# Patient Record
Sex: Female | Born: 1995 | Hispanic: Yes | Marital: Single | Smoking: Never smoker
Health system: Southern US, Community
[De-identification: ages and names within clinical notes are randomized; demographics above are authoritative.]

## PROBLEM LIST (undated history)

## (undated) DIAGNOSIS — Z789 Other specified health status: Secondary | ICD-10-CM

## (undated) HISTORY — PX: NO PAST SURGERIES: SHX2092

## (undated) HISTORY — DX: Other specified health status: Z78.9

---
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
## 2017-04-17 LAB — OB RESULTS CONSOLE ABO/RH: RH Type: POSITIVE

## 2017-04-17 LAB — OB RESULTS CONSOLE PLATELET COUNT: Platelets: 214

## 2017-04-17 LAB — OB RESULTS CONSOLE ANTIBODY SCREEN: Antibody Screen: NEGATIVE

## 2017-04-17 LAB — OB RESULTS CONSOLE HGB/HCT, BLOOD
HEMATOCRIT: 34
HEMOGLOBIN: 12

## 2017-04-17 LAB — OB RESULTS CONSOLE HEPATITIS B SURFACE ANTIGEN: HEP B S AG: NEGATIVE

## 2017-04-17 LAB — OB RESULTS CONSOLE GC/CHLAMYDIA
Chlamydia: NEGATIVE
Gonorrhea: NEGATIVE

## 2017-04-17 LAB — OB RESULTS CONSOLE VARICELLA ZOSTER ANTIBODY, IGG: Varicella: IMMUNE

## 2017-04-17 LAB — HEMOGLOBIN EVAL RFX ELECTROPHORESIS: Hemoglobin Evaluation: NORMAL

## 2017-04-17 LAB — OB RESULTS CONSOLE HIV ANTIBODY (ROUTINE TESTING): HIV: NONREACTIVE

## 2017-04-17 LAB — OB RESULTS CONSOLE RPR: RPR: NONREACTIVE

## 2017-04-17 LAB — OB RESULTS CONSOLE RUBELLA ANTIBODY, IGM: RUBELLA: IMMUNE

## 2017-07-10 ENCOUNTER — Encounter: Payer: Self-pay | Admitting: Advanced Practice Midwife

## 2017-07-23 ENCOUNTER — Telehealth: Payer: Self-pay | Admitting: Family Medicine

## 2017-07-23 ENCOUNTER — Encounter: Payer: Self-pay | Admitting: Family Medicine

## 2017-07-23 NOTE — Telephone Encounter (Signed)
Called patient about missed appointment. Was not to leave a message. Will send a certified letter.

## 2017-08-11 ENCOUNTER — Encounter: Payer: Self-pay | Admitting: Obstetrics & Gynecology

## 2017-08-11 ENCOUNTER — Encounter: Payer: Self-pay | Admitting: General Practice

## 2017-08-27 ENCOUNTER — Ambulatory Visit (INDEPENDENT_AMBULATORY_CARE_PROVIDER_SITE_OTHER): Payer: Self-pay | Admitting: Advanced Practice Midwife

## 2017-08-27 ENCOUNTER — Encounter: Payer: Self-pay | Admitting: Advanced Practice Midwife

## 2017-08-27 VITALS — BP 95/54 | HR 99 | Ht 62.0 in | Wt 144.0 lb

## 2017-08-27 DIAGNOSIS — Z3403 Encounter for supervision of normal first pregnancy, third trimester: Secondary | ICD-10-CM | POA: Insufficient documentation

## 2017-08-27 DIAGNOSIS — Z23 Encounter for immunization: Secondary | ICD-10-CM

## 2017-08-27 LAB — POCT URINALYSIS DIP (DEVICE)
BILIRUBIN URINE: NEGATIVE
GLUCOSE, UA: NEGATIVE mg/dL
HGB URINE DIPSTICK: NEGATIVE
KETONES UR: NEGATIVE mg/dL
LEUKOCYTES UA: NEGATIVE
Nitrite: NEGATIVE
Protein, ur: NEGATIVE mg/dL
SPECIFIC GRAVITY, URINE: 1.025 (ref 1.005–1.030)
Urobilinogen, UA: 0.2 mg/dL (ref 0.0–1.0)
pH: 6 (ref 5.0–8.0)

## 2017-08-27 NOTE — Progress Notes (Signed)
   PRENATAL VISIT NOTE  Subjective:  Alexandria [name] is a 21 y.o. G1P0 at [redacted]w[redacted]d by 18 wk ultrasound according to [facility] progress note being seen today for ongoing prenatal care.  Previously received prenatal care at [facility]. Patients says she had GTT at health department. She has been getting growth interval ultrasounds but is unsure why. She is currently monitored for the following issues for this low-risk pregnancy and has Encounter for supervision of normal first pregnancy in third trimester on her problem list.  Patient reports no complaints.  Contractions: Irritability. Vag. Bleeding: None.  Movement: Present. Denies leaking of fluid.   The following portions of the patient's history were reviewed and updated as appropriate: allergies, current medications, past family history, past medical history, past social history, past surgical history and problem list. Problem list updated.  Objective:   Vitals:   08/27/17 0824 08/27/17 0825 08/27/17 0827  BP: (!) 114/98  (!) 95/54  Pulse: 99    Weight: 144 lb (65.3 kg)    Height:   (1.575 m)     Fetal Status: Fetal Heart Rate (bpm): 140 Fundal Height: 28 cm Movement: Present     General:  Alert, oriented and cooperative. Patient is in no acute distress.  Skin: Skin is warm and dry. No rash noted.   Cardiovascular: Normal heart rate noted  Respiratory: Normal respiratory effort, no problems with respiration noted  Abdomen: Soft, gravid, appropriate for gestational age.  Pain/Pressure: Present     Pelvic: Cervical exam deferred        Extremities: Normal range of motion.  Edema: None  Mental Status:  Normal mood and affect. Normal behavior. Normal judgment and thought content.   Assessment and Plan:  Pregnancy: G1P0 at [redacted]w[redacted]d gestation  1. Encounter for supervision of normal first pregnancy in third trimester -reviewed records in Care Everywhere: noted that there was an abnormal quad screen diagnosis but  cannot view result and patient is uncertain of this diagnosis. Will request records from downtown [place]. Need GTT results. Need ultrasound reports for dating and serial ultrasounds for growth. Patient is measuring at 28 cm today and according to previous progress notes should be [redacted]w[redacted]d. Will wait for repeat ultrasound until we can review previous ultrasound reports.  Given tdap and flu today Will get GTT at next visit.  Preterm labor symptoms and general obstetric precautions including but not limited to vaginal bleeding, contractions, leaking of fluid and fetal movement were reviewed in detail with the patient. Please refer to After Visit Summary for other counseling recommendations.  Return in about 1 week (around 09/03/2017).   Rolm Bookbinder, DO

## 2017-08-27 NOTE — Addendum Note (Signed)
Addended by: Garret Reddish on: 08/27/2017 09:33 AM   Modules accepted: Orders, SmartSet

## 2017-08-27 NOTE — Patient Instructions (Signed)

## 2017-08-27 NOTE — Progress Notes (Signed)
Patient does not intend to breastfeed Medicaid home screening form filled out by patient ROI signed for records

## 2017-08-28 ENCOUNTER — Encounter: Payer: Self-pay | Admitting: *Deleted

## 2017-09-04 ENCOUNTER — Other Ambulatory Visit: Payer: Self-pay | Admitting: Obstetrics & Gynecology

## 2017-09-04 ENCOUNTER — Ambulatory Visit (INDEPENDENT_AMBULATORY_CARE_PROVIDER_SITE_OTHER): Payer: Self-pay | Admitting: Obstetrics & Gynecology

## 2017-09-04 ENCOUNTER — Encounter: Payer: Self-pay | Admitting: Student

## 2017-09-04 ENCOUNTER — Ambulatory Visit (HOSPITAL_COMMUNITY)
Admission: RE | Admit: 2017-09-04 | Discharge: 2017-09-04 | Disposition: A | Discharge status: Home / Self Care | Payer: Self-pay | Source: Ambulatory Visit | Attending: Obstetrics & Gynecology | Admitting: Obstetrics & Gynecology

## 2017-09-04 VITALS — BP 107/45 | HR 90 | Wt 141.9 lb

## 2017-09-04 DIAGNOSIS — Z3689 Encounter for other specified antenatal screening: Secondary | ICD-10-CM

## 2017-09-04 DIAGNOSIS — Z3403 Encounter for supervision of normal first pregnancy, third trimester: Secondary | ICD-10-CM

## 2017-09-04 DIAGNOSIS — O281 Abnormal biochemical finding on antenatal screening of mother: Secondary | ICD-10-CM

## 2017-09-04 DIAGNOSIS — Z3A35 35 weeks gestation of pregnancy: Secondary | ICD-10-CM

## 2017-09-04 IMAGING — US US MFM OB COMP +14 WKS
1 series · 14 of 28 positions shown · non-contrast
Comparison: none

[Series 1: us mfm ob comp +14 wks · 40 acquisitions, 14 frames shown]
[im 2/40]
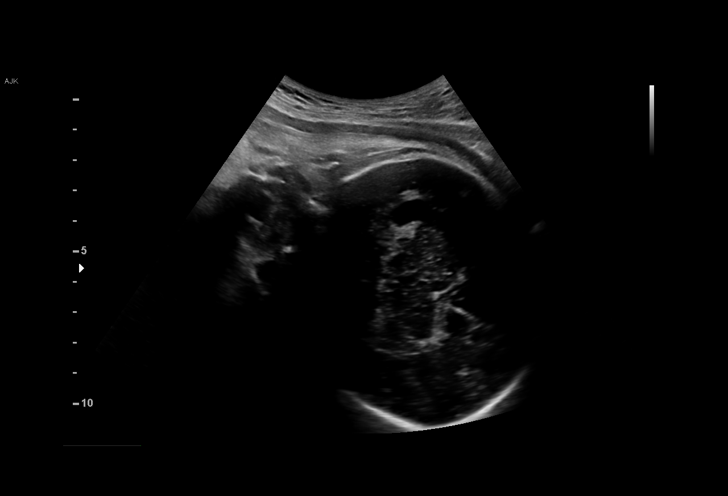
[im 5/40]
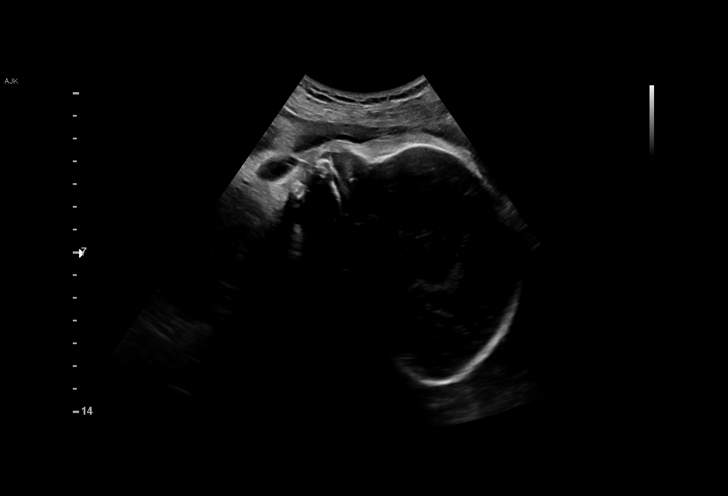
[im 8/40]
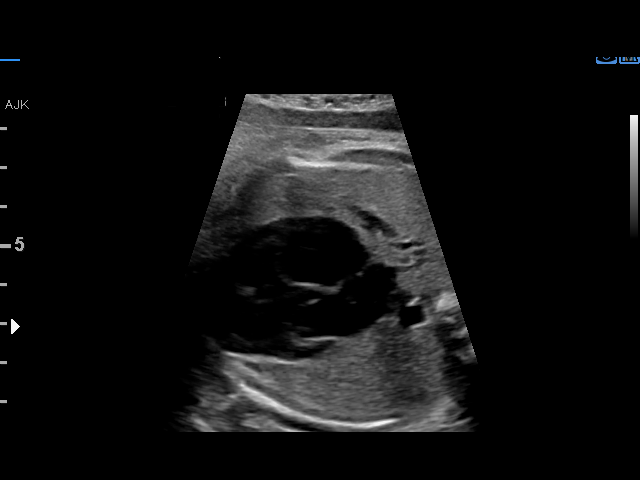
[im 11/40]
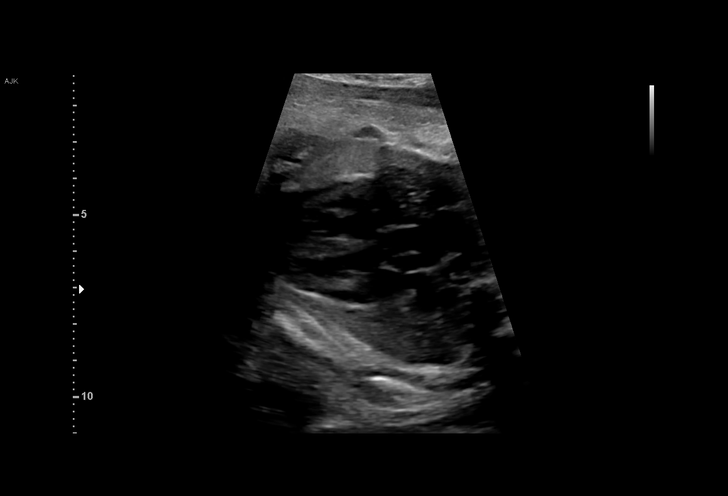
[im 14/40]
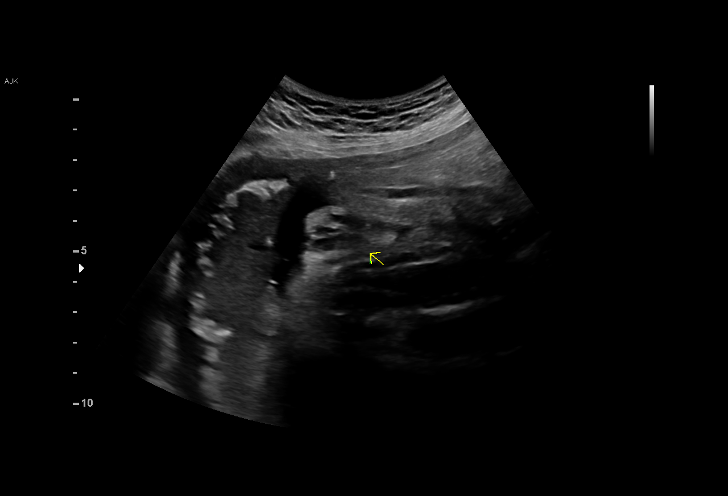
[im 16/40]
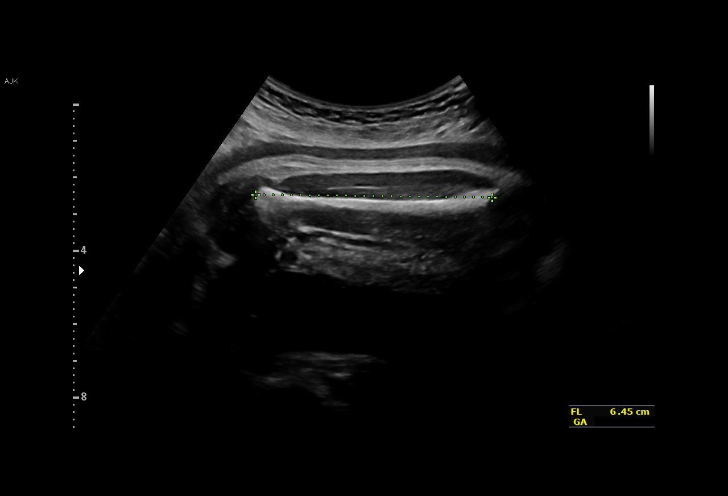
[im 19/40]
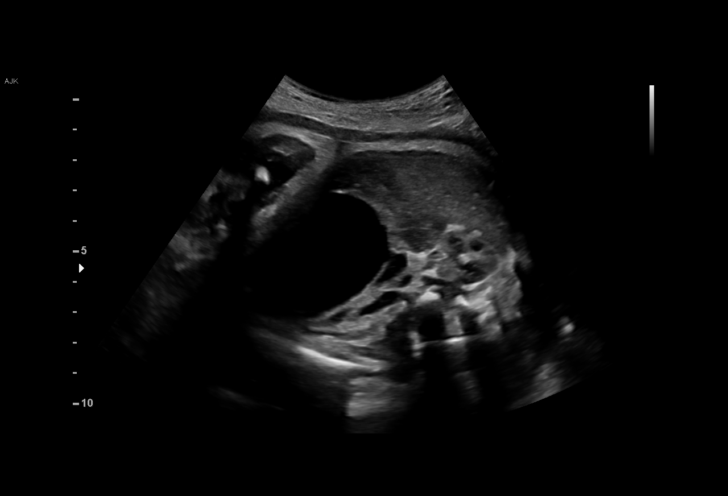
[im 22/40]
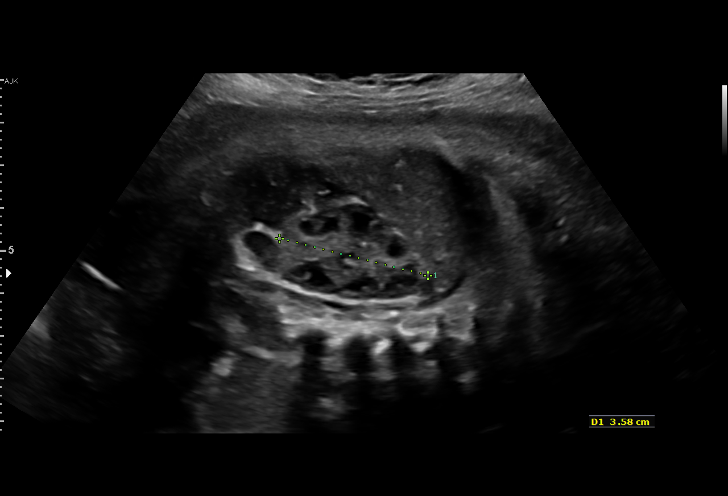
[im 25/40]
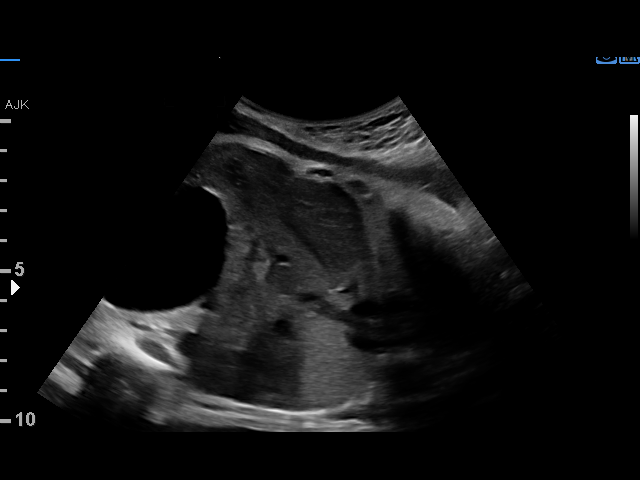
[im 28/40]
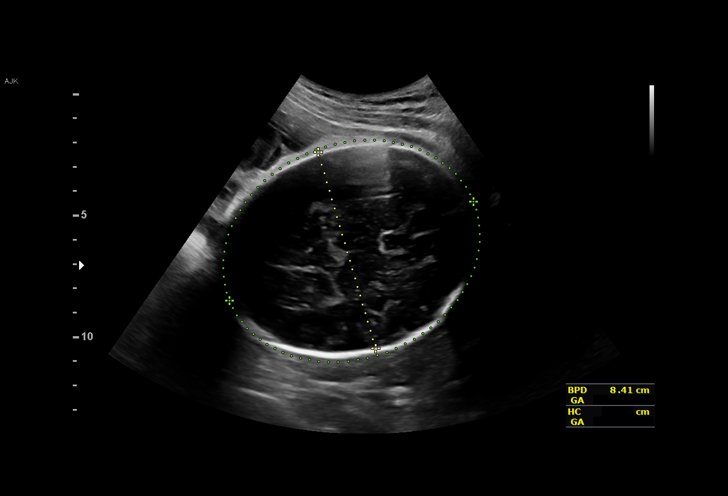
[im 31/40]
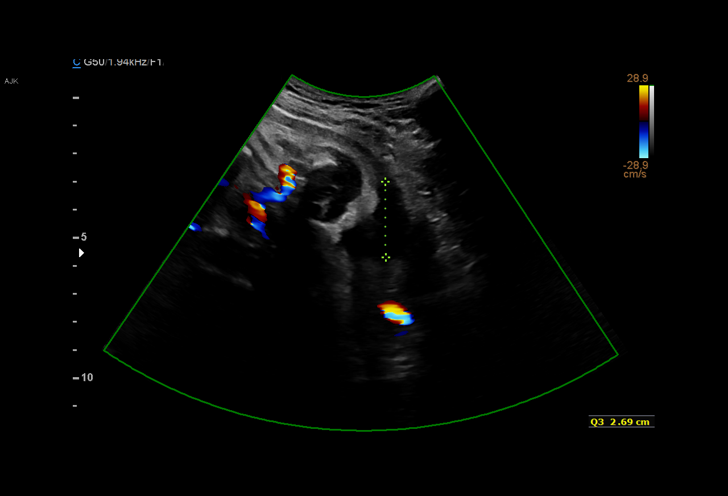
[im 34/40]
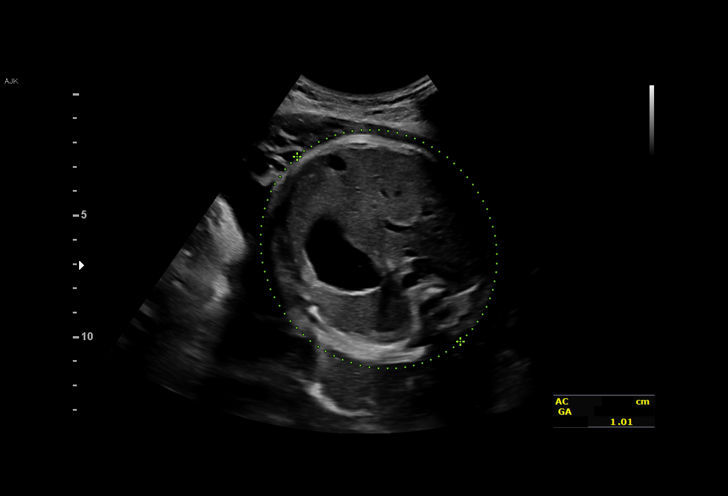
[im 37/40]
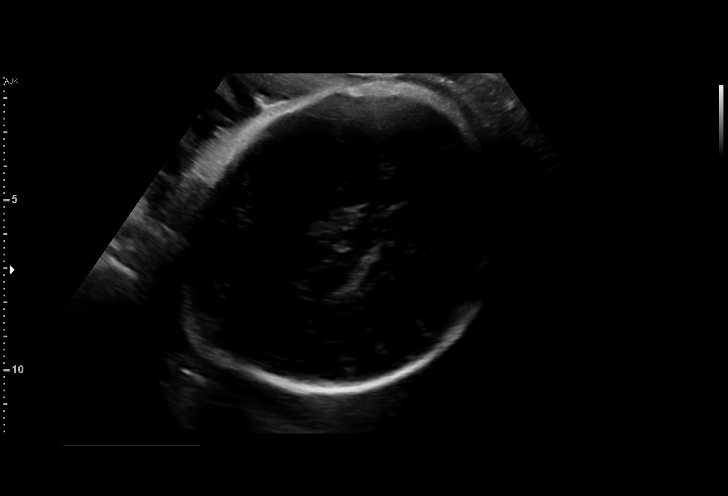
[im 40/40]
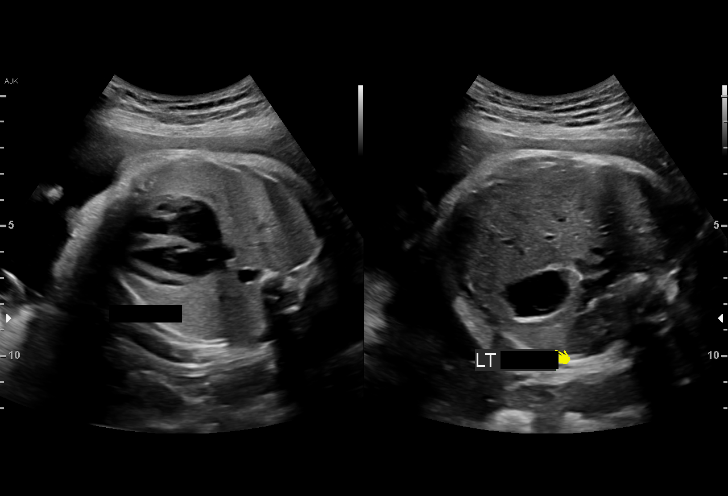

[14 of 28 positions shown; findings below may reference images not displayed]

[REDACTED]

1  MARIO SERGIO             [PHONE_NUMBER]      [PHONE_NUMBER]     [PHONE_NUMBER]
Indications

35 weeks gestation of pregnancy
Encounter for fetal anatomic survey            [EJ]
OB History

Gravidity:    1
Fetal Evaluation

Num Of Fetuses:     1
Fetal Heart         157
Rate(bpm):
Cardiac Activity:   Observed
Presentation:       Cephalic
Placenta:           Posterior, above cervical os

Amniotic Fluid
AFI FV:      Subjectively within normal limits

AFI Sum(cm)     %Tile       Largest Pocket(cm)
7.88            5

RUQ(cm)       RLQ(cm)       LUQ(cm)        LLQ(cm)
1.99
Biometry

BPD:      84.1  mm     G. Age:  33w 6d         21  %    CI:        74.65   %    70 - 86
FL/HC:       21.0  %    20.1 -
HC:      308.9  mm     G. Age:  34w 3d         10  %    HC/AC:       1.02       0.93 -
AC:      303.2  mm     G. Age:  34w 2d         36  %    FL/BPD:      77.3  %    71 - 87
FL:         65  mm     G. Age:  33w 4d         11  %    FL/AC:       21.4  %    20 - 24
Est. FW:    [EJ]   gm     5 lb 2 oz     42  %
Gestational Age

U/S Today:     34w 0d                                        EDD:   [DATE]
Best:          35w 0d     Det. By:  Previous Ultrasound      EDD:   [DATE]
([DATE])
Anatomy

Cranium:               Appears normal         Aortic Arch:            Not well visualized
Cavum:                 Appears normal         Ductal Arch:            Not well visualized
Ventricles:            Not well visualized    Diaphragm:              Appears normal
Choroid Plexus:        Appears normal         Stomach:                Appears normal, left
sided
Cerebellum:            Not well visualized    Abdomen:                Appears normal
Posterior Fossa:       Not well visualized    Abdominal Wall:         Not well visualized
Nuchal Fold:           Not applicable (>20    Cord Vessels:           Appears normal (3
wks GA)                                        vessel cord)
Face:                  Appears normal         Kidneys:                Appear normal
(orbits and profile)
Lips:                  Appears normal         Bladder:                Appears normal
Thoracic:              Appears normal         Spine:                  Not well visualized
Heart:                 Appears normal         Upper Extremities:      Not well visualized
(4CH, axis, and
situs)
RVOT:                  Appears normal         Lower Extremities:      Not well visualized
LVOT:                  Appears normal

Other:  Fetus appears to be a female. Technically difficult due to advanced
gestational age.
Cervix Uterus Adnexa

Cervix
Not visualized (advanced GA >[EJ])

Uterus
No abnormality visualized.

Left Ovary
Not visualized.

Right Ovary
Not visualized.

Adnexa:       No abnormality visualized.
Impression

SIUP at 35+0 weeks
Normal but limited detailed fetal anatomy; no gross
abnormalities identified
Normal amniotic fluid volume
Measurements consistent with a prior US; EFW at the 42nd
%tile
Recommendations

Follow-up as clinically indicated

## 2017-09-04 NOTE — Progress Notes (Signed)
2

## 2017-09-04 NOTE — Progress Notes (Signed)
   PRENATAL VISIT NOTE  Subjective:  Alexandria [name] is a 21 y.o. G1P0 at 104w0d being seen today for ongoing prenatal care.  She is currently monitored for the following issues for this high-risk pregnancy and has Encounter for supervision of normal first pregnancy in third trimester on her problem list.  Patient reports no complaints.  Contractions: Irregular. Vag. Bleeding: None.  Movement: Present. Denies leaking of fluid.   The following portions of the patient's history were reviewed and updated as appropriate: allergies, current medications, past family history, past medical history, past social history, past surgical history and problem list. Problem list updated.  Objective:   Vitals:   09/04/17 0758  BP: (!) 107/45  Pulse: 90  Weight: 141 lb 14.4 oz (64.4 kg)    Fetal Status: Fetal Heart Rate (bpm): 145 Fundal Height: 34 cm Movement: Present     General:  Alert, oriented and cooperative. Patient is in no acute distress.  Skin: Skin is warm and dry. No rash noted.   Cardiovascular: Normal heart rate noted  Respiratory: Normal respiratory effort, no problems with respiration noted  Abdomen: Soft, gravid, appropriate for gestational age.  Pain/Pressure: Present     Pelvic: Cervical exam deferred        Extremities: Normal range of motion.  Edema: None  Mental Status:  Normal mood and affect. Normal behavior. Normal judgment and thought content.   Assessment and Plan:  Pregnancy: G1P0 at [redacted]w[redacted]d  1. Encounter for supervision of normal first pregnancy in third trimester Had abnl Quad screen - [place] MFM OB FOLLOW UP; Future  Preterm labor symptoms and general obstetric precautions including but not limited to vaginal bleeding, contractions, leaking of fluid and fetal movement were reviewed in detail with the patient. Please refer to After Visit Summary for other counseling recommendations.  Return in about 1 week (around 09/11/2017).   Scheryl Darter, MD

## 2017-09-04 NOTE — Patient Instructions (Signed)
AREA PEDIATRIC/FAMILY PRACTICE PHYSICIANS  [facility] [address], [address] [place], [place]  [address] Phone - [phone]   Fax - [phone]  [facility] [address] [place], [place] [address] Phone - [phone]   Fax - [phone]  [facility] [address] [place], [place]  [address] Phone - [phone]   Fax - [phone]  [facility] [address] [place], [place]  [address] Phone - [phone]   Fax - [phone]  [facility] [address] [place], [place]  [address] Phone - [phone]   Fax - [phone]  [facility] [address] [place], [place]  [address] Phone - [phone]   Fax - [phone]  [facility] [address] [place], [place]  [address] Phone - [phone]   Fax - [phone]  [facility] [address] [place], [place]  [address] Phone - [phone]   Fax - [phone]  [facility] [address] [place], [place]  [address] Phone - [phone]   Fax - [phone] [facility] [address] [place], [place]  [address] Phone - [phone]   Fax - [phone]  [facility] [address] [place], [place]  [address] Phone - [phone]   Fax - [phone]  [facility] [address] [place], [place]  [address] Phone - [phone]   Fax - [phone]  [facility] [address] [place], [place]  [address] Phone - [phone]   Fax - [phone]  [facility] [address] [place], [place]  [address] Phone - [phone]  [facility] [address] [place], [place]  [address] Phone - [phone]   Fax - [phone]  [facility] [address] [place], [place]  [address] Phone - [phone]   Fax - [phone]  [facility] [address] [place], [place]  [address] Phone - [phone]   Fax - [phone]  [facility] [address] [place], [place]  [address] Phone - [phone]   Fax - [phone]   [facility] [address] [place], [place]  [address] Phone - [phone]   Fax - [phone]  [facility] [address] [place], [place]  [address] Phone - [phone]   Fax - [phone]  [facility] [address] [place], [place]  [address] Phone - [phone]   Fax - [phone]  [facility] [address] [place], [place]  [address] Phone - [phone]   Fax - [phone]  [facility] [address] [place], [place]  [address] Phone - [phone]   Fax - [phone] [facility] [address] [place], [place]  [address] Phone - [phone]   Fax - [phone]  [facility] [address] [place], [place]  [address] Phone - [phone]   Fax - [phone]  [facility] [address] ,   [address] Phone - [phone]   Fax - [phone]  [facility] Charlene Flemming MD [address] [place]  [address] Phone [phone]  Fax [phone]   

## 2017-09-11 ENCOUNTER — Encounter: Payer: Self-pay | Admitting: Obstetrics & Gynecology

## 2017-09-11 ENCOUNTER — Ambulatory Visit (INDEPENDENT_AMBULATORY_CARE_PROVIDER_SITE_OTHER): Payer: Self-pay | Admitting: Student

## 2017-09-11 VITALS — BP 105/67 | HR 82 | Wt 144.8 lb

## 2017-09-11 DIAGNOSIS — Z3403 Encounter for supervision of normal first pregnancy, third trimester: Secondary | ICD-10-CM

## 2017-09-11 DIAGNOSIS — Z113 Encounter for screening for infections with a predominantly sexual mode of transmission: Secondary | ICD-10-CM

## 2017-09-11 DIAGNOSIS — Z3400 Encounter for supervision of normal first pregnancy, unspecified trimester: Secondary | ICD-10-CM

## 2017-09-11 LAB — OB RESULTS CONSOLE GBS: STREP GROUP B AG: NEGATIVE

## 2017-09-11 NOTE — Patient Instructions (Signed)
Contracciones de Braxton Hicks °(Braxton Hicks Contractions) °Durante el embarazo, pueden presentarse contracciones uterinas que no siempre indican que está en trabajo de parto. °¿QUÉ SON LAS CONTRACCIONES DE BRAXTON HICKS? °Las contracciones que se presentan antes del trabajo de parto se conocen como contracciones de Braxton Hicks o falso trabajo de parto. Hacia el final del embarazo (32 a 34 semanas), estas contracciones pueden aparecen con más frecuencia y volverse más intensas. No corresponden al trabajo de parto verdadero porque estas contracciones no producen el agrandamiento (la dilatación) y el afinamiento del cuello del útero. Algunas veces, es difícil distinguirlas del trabajo de parto verdadero porque en algunos casos pueden ser muy intensas, y las personas tienen diferentes niveles de tolerancia al dolor. No debe sentirse avergonzada si concurre al hospital con falso trabajo de parto. En ocasiones, la única forma de saber si el trabajo de parto es verdadero es que el médico determine si hay cambios en el cuello del útero. °Si no hay problemas prenatales u otras complicaciones de salud asociadas con el embarazo, no habrá inconvenientes si la envían a su casa con falso trabajo de parto y espera que comience el verdadero. °CÓMO DIFERENCIAR EL TRABAJO DE PARTO FALSO DEL VERDADERO °Falso trabajo de parto  °· Las contracciones del falso trabajo de parto duran menos y no son tan intensas como las verdaderas. °· Generalmente son irregulares. °· A menudo, se sienten en la parte delantera de la parte baja del abdomen y en la ingle, °· y pueden desaparecer cuando camina o cambia de posición mientras está acostada. °· Las contracciones se vuelven más débiles y su duración es menor a medida que el tiempo transcurre. °· Por lo general, no se hacen progresivamente más intensas, regulares y cercanas entre sí como en el caso del trabajo de parto verdadero. °Verdadero trabajo de parto  °· Las contracciones del verdadero  trabajo de parto duran de 30 a 70 segundos, son muy regulares y suelen volverse más intensas, y aumenta su frecuencia. °· No desaparecen cuando camina. °· La molestia generalmente se siente en la parte superior del útero y se extiende hacia la zona inferior del abdomen y hacia la cintura. °· El médico podrá examinarla para determinar si el trabajo de parto es verdadero. El examen mostrará si el cuello del útero se está dilatando y afinando. °LO QUE DEBE RECORDAR °· Continúe haciendo los ejercicios habituales y siga otras indicaciones que el médico le dé. °· Tome todos los medicamentos como le indicó el médico. °· Concurra a las visitas prenatales regulares. °· Coma y beba con moderación si cree que está en trabajo de parto. °· Si las contracciones de Braxton Hicks le provocan incomodidad: °¨ Cambie de posición: si está acostada o descansando, camine; si está caminando, descanse. °¨ Siéntese y descanse en una bañera con agua tibia. °¨ Beba 2 o 3 vasos de agua. La deshidratación puede provocar contracciones. °¨ Respire lenta y profundamente varias veces por hora. °¿CUÁNDO DEBO BUSCAR ASISTENCIA MÉDICA INMEDIATA? °Solicite atención médica de inmediato si: °· Las contracciones se intensifican, se hacen más regulares y cercanas entre sí. °· Tiene una pérdida de líquido por la vagina. °· Tiene fiebre. °· Elimina mucosidad manchada con sangre. °· Tiene una hemorragia vaginal abundante. °· Tiene dolor abdominal permanente. °· Tiene un dolor en la zona lumbar que nunca tuvo antes. °· Siente que la cabeza del bebé empuja hacia abajo y ejerce presión en la zona pélvica. °· El bebé no se mueve tanto como solía. °Esta información no tiene como fin reemplazar el   consejo del médico. Asegúrese de hacerle al médico cualquier pregunta que tenga. °Document Released: 08/28/2005 Document Revised: 03/11/2016 Document Reviewed: 08/30/2013 °Elsevier Interactive Patient Education © 2017 Elsevier Inc[employer] ° °

## 2017-09-11 NOTE — Progress Notes (Signed)
   PRENATAL VISIT NOTE  Subjective:  Alexandria [name] is a 21 y.o. G1P0 at [redacted]w[redacted]d being seen today for ongoing prenatal care.  She is currently monitored for the following issues for this low-risk pregnancy and has Encounter for supervision of normal first pregnancy in third trimester on her problem list.  Patient reports no complaints.  Contractions: Irregular. Vag. Bleeding: None.  Movement: Present. Denies leaking of fluid.   The following portions of the patient's history were reviewed and updated as appropriate: allergies, current medications, past family history, past medical history, past social history, past surgical history and problem list. Problem list updated.  Objective:   Vitals:   09/11/17 1511  BP: 105/67  Pulse: 82  Weight: 144 lb 12.8 oz (65.7 kg)    Fetal Status: Fetal Heart Rate (bpm): 139 Fundal Height: 36 cm Movement: Present     General:  Alert, oriented and cooperative. Patient is in no acute distress.  Skin: Skin is warm and dry. No rash noted.   Cardiovascular: Normal heart rate noted  Respiratory: Normal respiratory effort, no problems with respiration noted  Abdomen: Soft, gravid, appropriate for gestational age.  Pain/Pressure: Present     Pelvic: Cervical exam deferred        Extremities: Normal range of motion.  Edema: None  Mental Status:  Normal mood and affect. Normal behavior. Normal judgment and thought content.   Assessment and Plan:  Pregnancy: G1P0 at [redacted]w[redacted]d  1. Supervision of normal first pregnancy, antepartum Patient doing well, no complaints.  - GC/Chlamydia probe amp ([place])not at [facility] - Culture, beta strep (group b only)  Term labor symptoms and general obstetric precautions including but not limited to vaginal bleeding, contractions, leaking of fluid and fetal movement were reviewed in detail with the patient. Please refer to After Visit Summary for other counseling recommendations.  Return in about 1 week (around  09/18/2017), or ROB.   Marylene Land, CNM

## 2017-09-12 LAB — GC/CHLAMYDIA PROBE AMP (~~LOC~~) NOT AT ARMC
CHLAMYDIA, DNA PROBE: NEGATIVE
NEISSERIA GONORRHEA: NEGATIVE

## 2017-09-15 LAB — CULTURE, BETA STREP (GROUP B ONLY): STREP GP B CULTURE: NEGATIVE

## 2017-09-16 ENCOUNTER — Encounter: Payer: Self-pay | Admitting: *Deleted

## 2017-09-18 ENCOUNTER — Ambulatory Visit (INDEPENDENT_AMBULATORY_CARE_PROVIDER_SITE_OTHER): Payer: Self-pay | Admitting: Student

## 2017-09-18 DIAGNOSIS — Z3403 Encounter for supervision of normal first pregnancy, third trimester: Secondary | ICD-10-CM

## 2017-09-18 NOTE — Patient Instructions (Signed)
Contracciones de Braxton Hicks °(Braxton Hicks Contractions) °Durante el embarazo, pueden presentarse contracciones uterinas que no siempre indican que está en trabajo de parto. °¿QUÉ SON LAS CONTRACCIONES DE BRAXTON HICKS? °Las contracciones que se presentan antes del trabajo de parto se conocen como contracciones de Braxton Hicks o falso trabajo de parto. Hacia el final del embarazo (32 a 34 semanas), estas contracciones pueden aparecen con más frecuencia y volverse más intensas. No corresponden al trabajo de parto verdadero porque estas contracciones no producen el agrandamiento (la dilatación) y el afinamiento del cuello del útero. Algunas veces, es difícil distinguirlas del trabajo de parto verdadero porque en algunos casos pueden ser muy intensas, y las personas tienen diferentes niveles de tolerancia al dolor. No debe sentirse avergonzada si concurre al hospital con falso trabajo de parto. En ocasiones, la única forma de saber si el trabajo de parto es verdadero es que el médico determine si hay cambios en el cuello del útero. °Si no hay problemas prenatales u otras complicaciones de salud asociadas con el embarazo, no habrá inconvenientes si la envían a su casa con falso trabajo de parto y espera que comience el verdadero. °CÓMO DIFERENCIAR EL TRABAJO DE PARTO FALSO DEL VERDADERO °Falso trabajo de parto  °· Las contracciones del falso trabajo de parto duran menos y no son tan intensas como las verdaderas. °· Generalmente son irregulares. °· A menudo, se sienten en la parte delantera de la parte baja del abdomen y en la ingle, °· y pueden desaparecer cuando camina o cambia de posición mientras está acostada. °· Las contracciones se vuelven más débiles y su duración es menor a medida que el tiempo transcurre. °· Por lo general, no se hacen progresivamente más intensas, regulares y cercanas entre sí como en el caso del trabajo de parto verdadero. °Verdadero trabajo de parto  °· Las contracciones del verdadero  trabajo de parto duran de 30 a 70 segundos, son muy regulares y suelen volverse más intensas, y aumenta su frecuencia. °· No desaparecen cuando camina. °· La molestia generalmente se siente en la parte superior del útero y se extiende hacia la zona inferior del abdomen y hacia la cintura. °· El médico podrá examinarla para determinar si el trabajo de parto es verdadero. El examen mostrará si el cuello del útero se está dilatando y afinando. °LO QUE DEBE RECORDAR °· Continúe haciendo los ejercicios habituales y siga otras indicaciones que el médico le dé. °· Tome todos los medicamentos como le indicó el médico. °· Concurra a las visitas prenatales regulares. °· Coma y beba con moderación si cree que está en trabajo de parto. °· Si las contracciones de Braxton Hicks le provocan incomodidad: °¨ Cambie de posición: si está acostada o descansando, camine; si está caminando, descanse. °¨ Siéntese y descanse en una bañera con agua tibia. °¨ Beba 2 o 3 vasos de agua. La deshidratación puede provocar contracciones. °¨ Respire lenta y profundamente varias veces por hora. °¿CUÁNDO DEBO BUSCAR ASISTENCIA MÉDICA INMEDIATA? °Solicite atención médica de inmediato si: °· Las contracciones se intensifican, se hacen más regulares y cercanas entre sí. °· Tiene una pérdida de líquido por la vagina. °· Tiene fiebre. °· Elimina mucosidad manchada con sangre. °· Tiene una hemorragia vaginal abundante. °· Tiene dolor abdominal permanente. °· Tiene un dolor en la zona lumbar que nunca tuvo antes. °· Siente que la cabeza del bebé empuja hacia abajo y ejerce presión en la zona pélvica. °· El bebé no se mueve tanto como solía. °Esta información no tiene como fin reemplazar el   consejo del médico. Asegúrese de hacerle al médico cualquier pregunta que tenga. °Document Released: 08/28/2005 Document Revised: 03/11/2016 Document Reviewed: 08/30/2013 °Elsevier Interactive Patient Education © 2017 Elsevier Inc[employer] ° °

## 2017-09-18 NOTE — Progress Notes (Signed)
   PRENATAL VISIT NOTE  Subjective:  Alexandria [name] is a 21 y.o. G1P0 at 4634w0d being seen today for ongoing prenatal care.  She is currently monitored for the following issues for this low-risk pregnancy and has Encounter for supervision of normal first pregnancy in third trimester on her problem list.  Patient reports no complaints.  Contractions: Irregular. Vag. Bleeding: None.  Movement: Present. Denies leaking of fluid.   The following portions of the patient's history were reviewed and updated as appropriate: allergies, current medications, past family history, past medical history, past social history, past surgical history and problem list. Problem list updated.  Objective:   Vitals:   09/18/17 0750  BP: (!) 108/58  Pulse: (!) 105  Weight: 148 lb 8 oz (67.4 kg)    Fetal Status: Fetal Heart Rate (bpm): 143 Fundal Height: 35 cm Movement: Present     General:  Alert, oriented and cooperative. Patient is in no acute distress.  Skin: Skin is warm and dry. No rash noted.   Cardiovascular: Normal heart rate noted  Respiratory: Normal respiratory effort, no problems with respiration noted  Abdomen: Soft, gravid, appropriate for gestational age.  Pain/Pressure: Absent     Pelvic: Cervical exam deferred        Extremities: Normal range of motion.  Edema: None  Mental Status:  Normal mood and affect. Normal behavior. Normal judgment and thought content.   Assessment and Plan:  Pregnancy: G1P0 at 134w0d  1. Encounter for supervision of normal first pregnancy in third trimester Patient doing well, no complaints.  - Glucose tolerance, 1 hour  Term labor symptoms and general obstetric precautions including but not limited to vaginal bleeding, contractions, leaking of fluid and fetal movement were reviewed in detail with the patient. Please refer to After Visit Summary for other counseling recommendations.  Return in about 1 week (around 09/25/2017), or ROB.   Marylene LandKathryn  Lorraine Delynn Olvera, CNM

## 2017-09-19 LAB — GLUCOSE TOLERANCE, 1 HOUR: GLUCOSE, 1HR PP: 116 mg/dL (ref 65–199)

## 2017-09-25 ENCOUNTER — Ambulatory Visit (INDEPENDENT_AMBULATORY_CARE_PROVIDER_SITE_OTHER): Payer: Self-pay | Admitting: Student

## 2017-09-25 DIAGNOSIS — Z3403 Encounter for supervision of normal first pregnancy, third trimester: Secondary | ICD-10-CM

## 2017-09-25 NOTE — Progress Notes (Signed)
   PRENATAL VISIT NOTE  Subjective:  Alexandria [name] is a 21 y.o. G1P0 at 234w0d being seen today for ongoing prenatal care.  She is currently monitored for the following issues for this low-risk pregnancy and has Encounter for supervision of normal first pregnancy in third trimester on her problem list.  Patient reports no complaints.  Contractions: Irregular. Vag. Bleeding: None.  Movement: Present. Denies leaking of fluid.   The following portions of the patient's history were reviewed and updated as appropriate: allergies, current medications, past family history, past medical history, past social history, past surgical history and problem list. Problem list updated.  Objective:   Vitals:   09/25/17 1118  BP: 124/64  Pulse: 92  Weight: 149 lb 9.6 oz (67.9 kg)    Fetal Status: Fetal Heart Rate (bpm): 144 Fundal Height: 37 cm Movement: Present  Presentation: Vertex  General:  Alert, oriented and cooperative. Patient is in no acute distress.  Skin: Skin is warm and dry. No rash noted.   Cardiovascular: Normal heart rate noted  Respiratory: Normal respiratory effort, no problems with respiration noted  Abdomen: Soft, gravid, appropriate for gestational age.  Pain/Pressure: Present     Pelvic: Cervical exam performed Dilation: Closed      Extremities: Normal range of motion.  Edema: None  Mental Status:  Normal mood and affect. Normal behavior. Normal judgment and thought content.   Assessment and Plan:  Pregnancy: G1P0 at 4534w0d  1. Encounter for supervision of normal first pregnancy in third trimester Patient doing well, reviewed warning signs and when to visit MAU.   Term labor symptoms and general obstetric precautions including but not limited to vaginal bleeding, contractions, leaking of fluid and fetal movement were reviewed in detail with the patient. Please refer to After Visit Summary for other counseling recommendations.  Return in about 1 week (around  10/02/2017), or ROB.   Alexandria [name], CNM

## 2017-09-25 NOTE — Patient Instructions (Signed)
Vaginal Delivery Vaginal delivery means that you will give birth by pushing your baby out of your birth canal (vagina). A team of health care providers will help you before, during, and after vaginal delivery. Birth experiences are unique for every woman and every pregnancy, and birth experiences vary depending on where you choose to give birth. What should I do to prepare for my baby's birth? Before your baby is born, it is important to talk with your health care provider about:  Your labor and delivery preferences. These may include: ? Medicines that you may be given. ? How you will manage your pain. This might include non-medical pain relief techniques or injectable pain relief such as epidural analgesia. ? How you and your baby will be monitored during labor and delivery. ? Who may be in the labor and delivery room with you. ? Your feelings about surgical delivery of your baby (cesarean delivery, or C-section) if this becomes necessary. ? Your feelings about receiving donated blood through an IV tube (blood transfusion) if this becomes necessary.  Whether you are able: ? To take pictures or videos of the birth. ? To eat during labor and delivery. ? To move around, walk, or change positions during labor and delivery.  What to expect after your baby is born, such as: ? Whether delayed umbilical cord clamping and cutting is offered. ? Who will care for your baby right after birth. ? Medicines or tests that may be recommended for your baby. ? Whether breastfeeding is supported in your hospital or birth center. ? How long you will be in the hospital or birth center.  How any medical conditions you have may affect your baby or your labor and delivery experience.  To prepare for your baby's birth, you should also:  Attend all of your health care visits before delivery (prenatal visits) as recommended by your health care provider. This is important.  Prepare your home for your baby's  arrival. Make sure that you have: ? Diapers. ? Baby clothing. ? Feeding equipment. ? Safe sleeping arrangements for you and your baby.  Install a car seat in your vehicle. Have your car seat checked by a certified car seat installer to make sure that it is installed safely.  Think about who will help you with your new baby at home for at least the first several weeks after delivery.  What can I expect when I arrive at the birth center or hospital? Once you are in labor and have been admitted into the hospital or birth center, your health care provider may:  Review your pregnancy history and any concerns you have.  Insert an IV tube into one of your veins. This is used to give you fluids and medicines.  Check your blood pressure, pulse, temperature, and heart rate (vital signs).  Check whether your bag of water (amniotic sac) has broken (ruptured).  Talk with you about your birth plan and discuss pain control options.  Monitoring Your health care provider may monitor your contractions (uterine monitoring) and your baby's heart rate (fetal monitoring). You may need to be monitored:  Often, but not continuously (intermittently).  All the time or for long periods at a time (continuously). Continuous monitoring may be needed if: ? You are taking certain medicines, such as medicine to relieve pain or make your contractions stronger. ? You have pregnancy or labor complications.  Monitoring may be done by:  Placing a special stethoscope or a handheld monitoring device on your abdomen to   check your baby's heartbeat, and feeling your abdomen for contractions. This method of monitoring does not continuously record your baby's heartbeat or your contractions.  Placing monitors on your abdomen (external monitors) to record your baby's heartbeat and the frequency and length of contractions. You may not have to wear external monitors all the time.  Placing monitors inside of your uterus  (internal monitors) to record your baby's heartbeat and the frequency, length, and strength of your contractions. ? Your health care provider may use internal monitors if he or she needs more information about the strength of your contractions or your baby's heart rate. ? Internal monitors are put in place by passing a thin, flexible wire through your vagina and into your uterus. Depending on the type of monitor, it may remain in your uterus or on your baby's head until birth. ? Your health care provider will discuss the benefits and risks of internal monitoring with you and will ask for your permission before inserting the monitors.  Telemetry. This is a type of continuous monitoring that can be done with external or internal monitors. Instead of having to stay in bed, you are able to move around during telemetry. Ask your health care provider if telemetry is an option for you.  Physical exam Your health care provider may perform a physical exam. This may include:  Checking whether your baby is positioned: ? With the head toward your vagina (head-down). This is most common. ? With the head toward the top of your uterus (head-up or breech). If your baby is in a breech position, your health care provider may try to turn your baby to a head-down position so you can deliver vaginally. If it does not seem that your baby can be born vaginally, your provider may recommend surgery to deliver your baby. In rare cases, you may be able to deliver vaginally if your baby is head-up (breech delivery). ? Lying sideways (transverse). Babies that are lying sideways cannot be delivered vaginally.  Checking your cervix to determine: ? Whether it is thinning out (effacing). ? Whether it is opening up (dilating). ? How low your baby has moved into your birth canal.  What are the three stages of labor and delivery?  Normal labor and delivery is divided into the following three stages: Stage 1  Stage 1 is the  longest stage of labor, and it can last for hours or days. Stage 1 includes: ? Early labor. This is when contractions may be irregular, or regular and mild. Generally, early labor contractions are more than 10 minutes apart. ? Active labor. This is when contractions get longer, more regular, more frequent, and more intense. ? The transition phase. This is when contractions happen very close together, are very intense, and may last longer than during any other part of labor.  Contractions generally feel mild, infrequent, and irregular at first. They get stronger, more frequent (about every 2-3 minutes), and more regular as you progress from early labor through active labor and transition.  Many women progress through stage 1 naturally, but you may need help to continue making progress. If this happens, your health care provider may talk with you about: ? Rupturing your amniotic sac if it has not ruptured yet. ? Giving you medicine to help make your contractions stronger and more frequent.  Stage 1 ends when your cervix is completely dilated to 4 inches (10 cm) and completely effaced. This happens at the end of the transition phase. Stage 2  Once   your cervix is completely effaced and dilated to 4 inches (10 cm), you may start to feel an urge to push. It is common for the body to naturally take a rest before feeling the urge to push, especially if you received an epidural or certain other pain medicines. This rest period may last for up to 1-2 hours, depending on your unique labor experience.  During stage 2, contractions are generally less painful, because pushing helps relieve contraction pain. Instead of contraction pain, you may feel stretching and burning pain, especially when the widest part of your baby's head passes through the vaginal opening (crowning).  Your health care provider will closely monitor your pushing progress and your baby's progress through the vagina during stage 2.  Your  health care provider may massage the area of skin between your vaginal opening and anus (perineum) or apply warm compresses to your perineum. This helps it stretch as the baby's head starts to crown, which can help prevent perineal tearing. ? In some cases, an incision may be made in your perineum (episiotomy) to allow the baby to pass through the vaginal opening. An episiotomy helps to make the opening of the vagina larger to allow more room for the baby to fit through.  It is very important to breathe and focus so your health care provider can control the delivery of your baby's head. Your health care provider may have you decrease the intensity of your pushing, to help prevent perineal tearing.  After delivery of your baby's head, the shoulders and the rest of the body generally deliver very quickly and without difficulty.  Once your baby is delivered, the umbilical cord may be cut right away, or this may be delayed for 1-2 minutes, depending on your baby's health. This may vary among health care providers, hospitals, and birth centers.  If you and your baby are healthy enough, your baby may be placed on your chest or abdomen to help maintain the baby's temperature and to help you bond with each other. Some mothers and babies start breastfeeding at this time. Your health care team will dry your baby and help keep your baby warm during this time.  Your baby may need immediate care if he or she: ? Showed signs of distress during labor. ? Has a medical condition. ? Was born too early (prematurely). ? Had a bowel movement before birth (meconium). ? Shows signs of difficulty transitioning from being inside the uterus to being outside of the uterus. If you are planning to breastfeed, your health care team will help you begin a feeding. Stage 3  The third stage of labor starts immediately after the birth of your baby and ends after you deliver the placenta. The placenta is an organ that develops  during pregnancy to provide oxygen and nutrients to your baby in the womb.  Delivering the placenta may require some pushing, and you may have mild contractions. Breastfeeding can stimulate contractions to help you deliver the placenta.  After the placenta is delivered, your uterus should tighten (contract) and become firm. This helps to stop bleeding in your uterus. To help your uterus contract and to control bleeding, your health care provider may: ? Give you medicine by injection, through an IV tube, by mouth, or through your rectum (rectally). ? Massage your abdomen or perform a vaginal exam to remove any blood clots that are left in your uterus. ? Empty your bladder by placing a thin, flexible tube (catheter) into your bladder. ? Encourage   you to breastfeed your baby. After labor is over, you and your baby will be monitored closely to ensure that you are both healthy until you are ready to go home. Your health care team will teach you how to care for yourself and your baby. This information is not intended to replace advice given to you by your health care provider. Make sure you discuss any questions you have with your health care provider. Document Released: 08/27/2008 Document Revised: 06/07/2016 Document Reviewed: 12/03/2015 Elsevier Interactive Patient Education  2018 [employer]  

## 2017-10-02 ENCOUNTER — Ambulatory Visit (INDEPENDENT_AMBULATORY_CARE_PROVIDER_SITE_OTHER): Payer: Self-pay | Admitting: Student

## 2017-10-02 VITALS — BP 106/62 | HR 104 | Wt 148.0 lb

## 2017-10-02 DIAGNOSIS — Z3403 Encounter for supervision of normal first pregnancy, third trimester: Secondary | ICD-10-CM

## 2017-10-02 NOTE — Patient Instructions (Signed)
Crecimiento del beb durante el embarazo (How a Baby Grows During Pregnancy) El embarazo comienza cuando el semen de un hombre ingresa al vulo de una mujer (fecundacin). Esto ocurre en una de las trompas de Falopio que conecta los ovarios con el tero. Al vulo fecundado se lo denomina embrin hasta que alcanza las 10semanas. A partir de las 10semanas y hasta el momento del parto, se llama feto. El vulo fecundado se desplaza por la trompa de Falopio hasta llegar al tero y luego se implanta en el endometrio y empieza crecer. El feto en crecimiento recibe oxgeno y nutrientes a travs del torrente sanguneo de la embarazada y de los tejidos que se forman (placenta) para la sustentacin fetal. La placenta es el sistema de sustentacin de la vida del feto, proporciona la nutricin y elimina los desechos. Informarse tanto como pueda sobre el embarazo y la forma en que se desarrolla el beb puede ayudarla a disfrutar de la experiencia, y, adems, a que se d cuenta de cundo puede haber un problema y cundo hacer preguntas. CUNTO DURA UN EMBARAZO NORMAL? Generalmente, el embarazo dura 280das, o unas 40semanas. Se divide tres trimestres:  Primer trimestre: desde la semana0 a la13.  Segundo trimestre: desde la semana14 a la27.  Tercer trimestre: desde la semana28 a la40. El da que se considera que el beb est listo para nacer (a trmino) es la fecha prevista de parto. CMO SE DESARROLLA EL BEB MES A MES? Primer mes  El vulo fecundado se implanta dentro del tero.  Algunas clulas formarn la placenta, y otras formarn el feto.  Empiezan a desarrollarse los brazos, las piernas, la mdula espinal, los pulmones y el corazn.  Al final del primer mes, el corazn comienza a latir. Segundo mes  Se forman los huesos, el odo interno, los prpados, las manos y los pies.  Se desarrollan los genitales.  Al final de las 8semanas, todos los rganos importantes estn en  desarrollo. Tercer mes  Se estn formando todos los rganos internos.  Se forman los dientes debajo de las encas.  Empiezan a crecer los huesos y los msculos. La columna vertebral tiene movimiento de flexin.  La piel es transparente.  Empiezan a formarse las uas de las manos y de los pies.  Los brazos y las piernas siguen alargndose, y se desarrollan las manos y los pies.  El feto mide aproximadamente 3pulgadas (7,6cm) de largo. Cuarto mes  La placenta est totalmente formada.  Se han formado los rganos sexuales externos, el cuello, las orejas, las cejas, los prpados y las uas de las manos.  El feto puede or, tragar y mover los brazos y las piernas.  Los riones empiezan a producir orina.  La piel est recubierta por una sustancia sebcea blanca (unto sebceo) y un vello muy fino (lanugo). Quinto mes  El feto se mueve ms y es posible sentirlo por primera vez (da pataditas).  Empieza a dormir y despertarse, y tal vez comience a chuparse el dedo.  Crecen las uas en las puntas de los dedos.  Funciona el rgano del sistema digestivo que produce bilis (vescula biliar) y ayuda a digerir los nutrientes.  Si el beb es nia, tiene vulos en los ovarios. Si el beb es varn, los testculos empiezan a descender hasta el escroto. Sexto mes  Se han formado los pulmones, pero el feto an no puede respirar.  Los ojos se abren. El cerebro sigue desarrollndose.  El beb tiene huellas en los dedos de las manos y   los pies. El cabello del beb se vuelve ms abundante.  A fines del segundo trimestre, el feto mide aproximadamente 9pulgadas (22,9cm) de largo. Sptimo mes  El feto patea y se estira.  Los ojos se han desarrollado lo suficiente como para percibir los cambios de luz.  Las manos pueden hacer movimientos de prensin.  El feto responde a los ruidos. Octavo mes  Todos los rganos, as como los sistemas y aparatos del organismo, estn totalmente  desarrollados y en funcionamiento.  Los huesos se solidifican, y se desarrollan los botones gustativos. Es posible que el feto tenga hipo.  Determinadas regiones del cerebro an se estn desarrollando. El crneo sigue siendo blando. Noveno mes  El feto aumenta aproximadamente libra (230g) cada semana.  Los pulmones estn totalmente desarrollados.  Se desarrollan los hbitos de sueo.  Generalmente, el feto se acomoda con la cabeza hacia abajo (presentacin ceflica de vrtice) en el tero para prepararse para el parto. En cambio, si los glteos se acomodan en esta posicin, el beb est de nalgas.  El feto pesa entre 6 y 9libras (2,72 y 4,08kg) y mide entre 19 y 20pulgadas (48,26 a 50,8cm) de largo. QU PUEDO HACER PARA QUE EL EMBARAZO SEA SANO Y PARA AYUDAR AL BEB A DESARROLLARSE? Comida y bebida  Consuma una dieta saludable. ? Hable con el mdico para asegurarse de que est recibiendo los nutrientes que usted y el beb necesitan. ? Visite www.choosemyplate.gov para obtener ms informacin sobre cmo crear una dieta saludable.  El mdico le aconsejar cul es la cantidad saludable de peso a aumentar durante el embarazo, por lo general, entre 25 y 35libras (11 y 16kg). Puede ser necesario que: ? Aumente ms si tena bajo peso antes de quedar embarazada o si est embarazada de ms de un beb. ? Aumente menos si tena sobrepeso u obesidad cuando qued embarazada. Medicamentos y vitaminas  Tome las vitaminas prenatales como se lo haya indicado el mdico, entre ellas, cido flico, hierro, calcio y vitaminaD, que son importantes para el desarrollo saludable.  Tome los medicamentos solamente como se lo haya indicado el mdico. Lea las etiquetas y consulte al farmacutico o al mdico si puede tomar medicamentos de venta libre, suplementos y medicamentos recetados durante el embarazo. Actividades  Haga actividad fsica como se lo haya aconsejado el mdico. Pdale al mdico que  le recomiende actividades que sean seguras para usted, como caminar o practicar natacin.  No participe en deportes extremos ni extenuantes. Estilo de vida  No beba alcohol.  No consuma ningn producto que contenga tabaco, lo que incluye cigarrillos, tabaco de mascar o cigarrillos electrnicos. Si necesita ayuda para dejar de fumar, consulte al mdico.  No consuma drogas. Seguridad  No se exponga al mercurio, al plomo ni a otros metales pesados. Pregntele al mdico acerca de las fuentes comunes de estos metales pesados.  Evite la infeccin por listeria durante el embarazo. Tome las siguientes precauciones: ? No coma quesos blandos ni fiambres. ? No coma perros calientes, salvo que hayan sido calentados al punto de emitir vapor, por ejemplo, en el microondas. ? No tome leche no pasteurizada.  Evite la infeccin por toxoplasmosis durante el embarazo. Tome las siguientes precauciones: ? No cambie la arena sanitaria del gato, si tiene uno. Pdale a otra persona que lo haga por usted. ? Use guantes de jardinera mientras trabaja en el jardn. Instrucciones generales  Concurra a todas las visitas de control como se lo haya indicado el mdico. Esto es importante. Estas incluyen las visitas   de cuidado prenatal y las pruebas de deteccin.  Mantenga las enfermedades crnicas bajo control. Trabaje en estrecha colaboracin con el mdico para mantener las enfermedades bajo control, por ejemplo, la diabetes. CMO S SI EL BEB SE EST DESARROLLANDO BIEN? En cada visita de cuidado prenatal, el mdico har varios estudios diferentes para controlar su estado de salud y hacer un seguimiento del desarrollo del beb. Estos incluyen los siguientes:  Altura uterina. ? El mdico le medir el vientre en crecimiento desde la parte superior a la inferior con una cinta mtrica. ? Adems, le palpar el vientre para determinar la posicin del beb.  Latido cardaco. ? Una ecografa realizada en el primer  trimestre puede confirmar el embarazo y mostrar un latido cardaco, dependiendo del tiempo de gestacin. ? El mdico controlar la frecuencia cardaca del beb en cada visita de cuidado prenatal. ? A medida que se aproxima la fecha de parto, tal vez se hagan controles habituales de la frecuencia cardaca para garantizar que no haya sufrimiento fetal.  Ecografa del segundo trimestre. ? Esta ecografa controla el desarrollo del beb y tambin indica su sexo. QU DEBO HACER SI TENGO ALGUNA INQUIETUD RESPECTO DEL DESARROLLO DEL BEB? Hable siempre con el mdico si tiene alguna inquietud. Esta informacin no tiene como fin reemplazar el consejo del mdico. Asegrese de hacerle al mdico cualquier pregunta que tenga. Document Released: 05/06/2008 Document Revised: 03/11/2016 Document Reviewed: 04/27/2014 Elsevier Interactive Patient Education  2018 [employer] Inc.  

## 2017-10-02 NOTE — Progress Notes (Signed)
   PRENATAL VISIT NOTE  Subjective:  Alexandria [name] is a 21 y.o. G1P0 at 5070w0d being seen today for ongoing prenatal care.  She is currently monitored for the following issues for this low-risk pregnancy and has Encounter for supervision of normal first pregnancy in third trimester on her problem list.  Patient reports no complaints.  Contractions: Irregular. Vag. Bleeding: None.  Movement: Present. Denies leaking of fluid.   The following portions of the patient's history were reviewed and updated as appropriate: allergies, current medications, past family history, past medical history, past social history, past surgical history and problem list. Problem list updated.  Objective:   Vitals:   10/02/17 1409  BP: 106/62  Pulse: (!) 104  Weight: 148 lb (67.1 kg)    Fetal Status: Fetal Heart Rate (bpm): 148 Fundal Height: 36 cm Movement: Present     General:  Alert, oriented and cooperative. Patient is in no acute distress.  Skin: Skin is warm and dry. No rash noted.   Cardiovascular: Normal heart rate noted  Respiratory: Normal respiratory effort, no problems with respiration noted  Abdomen: Soft, gravid, appropriate for gestational age.  Pain/Pressure: Present     Pelvic: Cervical exam deferred        Extremities: Normal range of motion.  Edema: None  Mental Status:  Normal mood and affect. Normal behavior. Normal judgment and thought content.   Assessment and Plan:  Pregnancy: G1P0 at 770w0d  1. Encounter for supervision of normal first pregnancy in third trimester Patient fundal height measuring and palpating small last week and this week (by this provider).  - US MFM OB FOLLOW UP; Future  Term labor symptoms and general obstetric precautions including but not limited to vaginal bleeding, contractions, leaking of fluid and fetal movement were reviewed in detail with the patient. Please refer to After Visit Summary for other counseling recommendations.  Return in  about 1 week (around 10/09/2017).   Marylene LandKathryn Lorraine , CNM

## 2017-10-06 ENCOUNTER — Inpatient Hospital Stay (HOSPITAL_COMMUNITY)
Admission: AD | Admit: 2017-10-06 | Discharge: 2017-10-07 | Disposition: A | Discharge status: Home / Self Care | Payer: Self-pay | Source: Ambulatory Visit | Attending: Obstetrics and Gynecology | Admitting: Obstetrics and Gynecology

## 2017-10-06 ENCOUNTER — Encounter (HOSPITAL_COMMUNITY): Payer: Self-pay | Admitting: Certified Nurse Midwife

## 2017-10-06 DIAGNOSIS — O479 False labor, unspecified: Secondary | ICD-10-CM

## 2017-10-06 DIAGNOSIS — O471 False labor at or after 37 completed weeks of gestation: Secondary | ICD-10-CM | POA: Insufficient documentation

## 2017-10-06 DIAGNOSIS — Z3A39 39 weeks gestation of pregnancy: Secondary | ICD-10-CM | POA: Insufficient documentation

## 2017-10-06 NOTE — MAU Note (Signed)
Patient presents to MAU with c/o labor. States she is having contractions every 5 minutes that started 2 hours ago. +FM and some bloody show when she wipes. Closed on last exam in October.

## 2017-10-08 ENCOUNTER — Encounter (HOSPITAL_COMMUNITY): Payer: Self-pay | Admitting: *Deleted

## 2017-10-08 ENCOUNTER — Encounter (HOSPITAL_COMMUNITY): Payer: Self-pay | Admitting: Anesthesiology

## 2017-10-08 ENCOUNTER — Ambulatory Visit (HOSPITAL_COMMUNITY)
Admission: RE | Admit: 2017-10-08 | Discharge: 2017-10-08 | Disposition: A | Discharge status: Home / Self Care | Payer: Medicaid Other | Source: Ambulatory Visit | Attending: Student | Admitting: Student

## 2017-10-08 ENCOUNTER — Inpatient Hospital Stay (HOSPITAL_COMMUNITY)
Admission: AD | Admit: 2017-10-08 | Discharge: 2017-10-08 | Disposition: A | Discharge status: Home / Self Care | Payer: Medicaid Other | Source: Ambulatory Visit | Attending: Obstetrics & Gynecology | Admitting: Obstetrics & Gynecology

## 2017-10-08 ENCOUNTER — Other Ambulatory Visit: Payer: Self-pay | Admitting: Student

## 2017-10-08 ENCOUNTER — Inpatient Hospital Stay (HOSPITAL_COMMUNITY)
Admission: AD | Admit: 2017-10-08 | Discharge: 2017-10-10 | DRG: 807 | Disposition: A | Discharge status: Home / Self Care | Payer: Medicaid Other | Source: Ambulatory Visit | Attending: Family Medicine | Admitting: Family Medicine

## 2017-10-08 ENCOUNTER — Inpatient Hospital Stay (HOSPITAL_COMMUNITY): Payer: Medicaid Other | Admitting: Anesthesiology

## 2017-10-08 DIAGNOSIS — O479 False labor, unspecified: Secondary | ICD-10-CM

## 2017-10-08 DIAGNOSIS — Z362 Encounter for other antenatal screening follow-up: Secondary | ICD-10-CM | POA: Insufficient documentation

## 2017-10-08 DIAGNOSIS — O365930 Maternal care for other known or suspected poor fetal growth, third trimester, not applicable or unspecified: Principal | ICD-10-CM | POA: Diagnosis present

## 2017-10-08 DIAGNOSIS — O26843 Uterine size-date discrepancy, third trimester: Secondary | ICD-10-CM

## 2017-10-08 DIAGNOSIS — Z3483 Encounter for supervision of other normal pregnancy, third trimester: Secondary | ICD-10-CM | POA: Diagnosis present

## 2017-10-08 DIAGNOSIS — Z3A39 39 weeks gestation of pregnancy: Secondary | ICD-10-CM

## 2017-10-08 DIAGNOSIS — O26893 Other specified pregnancy related conditions, third trimester: Secondary | ICD-10-CM | POA: Insufficient documentation

## 2017-10-08 DIAGNOSIS — Z3403 Encounter for supervision of normal first pregnancy, third trimester: Secondary | ICD-10-CM

## 2017-10-08 LAB — ABO/RH: ABO/RH(D): O POS

## 2017-10-08 LAB — CBC
HEMATOCRIT: 34.5 % — AB (ref 36.0–46.0)
HEMOGLOBIN: 11.9 g/dL — AB (ref 12.0–15.0)
MCH: 30.2 pg (ref 26.0–34.0)
MCHC: 34.5 g/dL (ref 30.0–36.0)
MCV: 87.6 fL (ref 78.0–100.0)
PLATELETS: 215 10*3/uL (ref 150–400)
RBC: 3.94 MIL/uL (ref 3.87–5.11)
RDW: 14.5 % (ref 11.5–15.5)
WBC: 11.2 10*3/uL — ABNORMAL HIGH (ref 4.0–10.5)

## 2017-10-08 LAB — TYPE AND SCREEN
ABO/RH(D): O POS
Antibody Screen: NEGATIVE

## 2017-10-08 MED ORDER — LACTATED RINGERS IV SOLN: 500.0000 mL | INTRAVENOUS | Status: DC | PRN

## 2017-10-08 MED ORDER — OXYTOCIN BOLUS FROM INFUSION: 500.0000 mL | Freq: Once | INTRAVENOUS | Status: DC

## 2017-10-08 MED ORDER — FENTANYL 2.5 MCG/ML BUPIVACAINE 1/10 % EPIDURAL INFUSION (WH - ANES)
14.0000 mL/h | INTRAMUSCULAR | Status: DC | PRN
  Administered 2017-10-08: 14 mL/h via EPIDURAL
  Filled 2017-10-08: qty 100

## 2017-10-08 MED ORDER — OXYCODONE-ACETAMINOPHEN 5-325 MG PO TABS
2.0000 | ORAL_TABLET | ORAL | Status: DC | PRN
Start: 2017-10-08 — End: 2017-10-09

## 2017-10-08 MED ORDER — OXYTOCIN BOLUS FROM INFUSION
500.0000 mL | Freq: Once | INTRAVENOUS | Status: AC
  Administered 2017-10-08: 500 mL via INTRAVENOUS

## 2017-10-08 MED ORDER — PHENYLEPHRINE 40 MCG/ML (10ML) SYRINGE FOR IV PUSH (FOR BLOOD PRESSURE SUPPORT)
80.0000 ug | PREFILLED_SYRINGE | INTRAVENOUS | Status: DC | PRN
  Filled 2017-10-08: qty 5

## 2017-10-08 MED ORDER — FLEET ENEMA 7-19 GM/118ML RE ENEM: 1.0000 | ENEMA | RECTAL | Status: DC | PRN

## 2017-10-08 MED ORDER — SOD CITRATE-CITRIC ACID 500-334 MG/5ML PO SOLN: 30.0000 mL | ORAL | Status: DC | PRN

## 2017-10-08 MED ORDER — ACETAMINOPHEN 325 MG PO TABS: 650.0000 mg | ORAL_TABLET | ORAL | Status: DC | PRN

## 2017-10-08 MED ORDER — DIPHENHYDRAMINE HCL 50 MG/ML IJ SOLN: 12.5000 mg | INTRAMUSCULAR | Status: DC | PRN

## 2017-10-08 MED ORDER — LACTATED RINGERS IV SOLN: INTRAVENOUS | Status: DC

## 2017-10-08 MED ORDER — OXYTOCIN 40 UNITS IN LACTATED RINGERS INFUSION - SIMPLE MED
INTRAVENOUS | Status: AC
  Filled 2017-10-08: qty 1000

## 2017-10-08 MED ORDER — LIDOCAINE HCL (PF) 1 % IJ SOLN
INTRAMUSCULAR | Status: DC | PRN
  Administered 2017-10-08: 6 mL via EPIDURAL
  Administered 2017-10-08: 4 mL

## 2017-10-08 MED ORDER — OXYTOCIN 40 UNITS IN LACTATED RINGERS INFUSION - SIMPLE MED: 2.5000 [IU]/h | INTRAVENOUS | Status: DC

## 2017-10-08 MED ORDER — EPHEDRINE 5 MG/ML INJ
10.0000 mg | INTRAVENOUS | Status: DC | PRN
  Filled 2017-10-08: qty 2

## 2017-10-08 MED ORDER — LACTATED RINGERS IV SOLN
INTRAVENOUS | Status: DC
  Administered 2017-10-08: 19:00:00 via INTRAVENOUS

## 2017-10-08 MED ORDER — LACTATED RINGERS IV SOLN
500.0000 mL | Freq: Once | INTRAVENOUS | Status: AC
  Administered 2017-10-08: 500 mL via INTRAVENOUS

## 2017-10-08 MED ORDER — PHENYLEPHRINE 40 MCG/ML (10ML) SYRINGE FOR IV PUSH (FOR BLOOD PRESSURE SUPPORT)
80.0000 ug | PREFILLED_SYRINGE | INTRAVENOUS | Status: DC | PRN
  Filled 2017-10-08: qty 5
  Filled 2017-10-08: qty 10

## 2017-10-08 MED ORDER — FENTANYL CITRATE (PF) 100 MCG/2ML IJ SOLN
100.0000 ug | INTRAMUSCULAR | Status: DC | PRN
  Administered 2017-10-08 (×3): 100 ug via INTRAVENOUS
  Filled 2017-10-08 (×3): qty 2

## 2017-10-08 MED ORDER — OXYCODONE-ACETAMINOPHEN 5-325 MG PO TABS: 1.0000 | ORAL_TABLET | ORAL | Status: DC | PRN

## 2017-10-08 MED ORDER — ONDANSETRON HCL 4 MG/2ML IJ SOLN: 4.0000 mg | Freq: Four times a day (QID) | INTRAMUSCULAR | Status: DC | PRN

## 2017-10-08 MED ORDER — LIDOCAINE HCL (PF) 1 % IJ SOLN
30.0000 mL | INTRAMUSCULAR | Status: DC | PRN
  Administered 2017-10-08: 30 mL via SUBCUTANEOUS
  Filled 2017-10-08: qty 30

## 2017-10-08 NOTE — Anesthesia Preprocedure Evaluation (Signed)
Anesthesia Evaluation  Patient identified by MRN, date of birth, ID band Patient awake    Reviewed: Allergy & Precautions, H&P , Patient's Chart, lab work & pertinent test results  Airway Mallampati: II  TM Distance: >3 FB Neck ROM: full    Dental no notable dental hx.    Pulmonary    Pulmonary exam normal breath sounds clear to auscultation       Cardiovascular Exercise Tolerance: Good  Rhythm:regular Rate:Normal     Neuro/Psych    GI/Hepatic   Endo/Other    Renal/GU      Musculoskeletal   Abdominal   Peds  Hematology   Anesthesia Other Findings   Reproductive/Obstetrics                             Anesthesia Physical  Anesthesia Plan  ASA: II  Anesthesia Plan: Epidural   Post-op Pain Management:    Induction:   PONV Risk Score and Plan:   Airway Management Planned:   Additional Equipment:   Intra-op Plan:   Post-operative Plan:   Informed Consent: I have reviewed the patients History and Physical, chart, labs and discussed the procedure including the risks, benefits and alternatives for the proposed anesthesia with the patient or authorized representative who has indicated his/her understanding and acceptance.     Dental Advisory Given  Plan Discussed with:   Anesthesia Plan Comments: (Labs checked- platelets confirmed with RN in room. Fetal heart tracing, per RN, reported to be stable enough for sitting procedure. Discussed epidural, and patient consents to the procedure:  included risk of possible headache,backache, failed block, allergic reaction, and nerve injury. This patient was asked if she had any questions or concerns before the procedure started.)        Anesthesia Quick Evaluation  

## 2017-10-08 NOTE — Anesthesia Pain Management Evaluation Note (Signed)
  CRNA Pain Management Visit Note  Patient: Alexandria [name], 21 y.o., female  "Hello I am a member of the anesthesia team at [facility]. We have an anesthesia team available at all times to provide care throughout the hospital, including epidural management and anesthesia for C-section. I don't know your plan for the delivery whether it a natural birth, water birth, IV sedation, nitrous supplementation, doula or epidural, but we want to meet your pain goals."   1.Was your pain managed to your expectations on prior hospitalizations?   No prior hospitalizations  2.What is your expectation for pain management during this hospitalization?     IV pain meds  3.How can we help you reach that goal? Nursing interventions.  Record the patient's initial score and the patient's pain goal.   Pain: 5  Pain Goal: 10 [facility] wants you to be able to say your pain was always managed very well.  Alexandria [name] 10/08/2017

## 2017-10-08 NOTE — Anesthesia Procedure Notes (Signed)
Epidural Patient location during procedure: OB  Staffing Anesthesiologist: Ezra Marquess, MD  Preanesthetic Checklist Completed: patient identified, pre-op evaluation, timeout performed, IV checked, risks and benefits discussed and monitors and equipment checked  Epidural Patient position: sitting Prep: DuraPrep Patient monitoring: blood pressure and continuous pulse ox Approach: right paramedian Location: L3-L4 Injection technique: LOR air  Needle:  Needle type: Tuohy  Needle gauge: 17 G Needle insertion depth: 5 cm Catheter type: closed end flexible Catheter size: 19 Gauge Catheter at skin depth: 10 cm Test dose: negative  Assessment Sensory level: T8  Additional Notes   Dosing of Epidural:  1st dose, through catheter .............................................  Xylocaine 40 mg  2nd dose, through catheter, after waiting 3 minutes.........Xylocaine 60 mg    As each dose occurred, patient was free of IV sx; and patient exhibited no evidence of SA injection.  Patient is more comfortable after epidural dosed. Please see RN's note for documentation of vital signs,and FHR which are stable.  Patient reminded not to try to ambulate with numb legs, and that an RN must be present when she attempts to get up.          

## 2017-10-08 NOTE — MAU Note (Signed)
Pt reports contractions, denies bleeding or ROM.  

## 2017-10-08 NOTE — H&P (Signed)
LABOR AND DELIVERY ADMISSION HISTORY AND PHYSICAL NOTE  Alexandria [name] is a 21 y.o. female G1P0000 with IUP at 5441w6d by LMP presenting for SOL.  She reports positive fetal movement. She denies leakage of fluid or vaginal bleeding.  She denies fever, recent sickness, headache, blurry vision, abdominal pain, nausea, vomiting, or swelling in her feet or legs.  [facility] Pregnancy complications:  - Early U/S showed small for gestational age but latest u/s on 10/4 showed normal growth -Patient had growth US today (11-7) and was told infant was 6 lbs 13 oz.   Past Medical History: Past Medical History:  Diagnosis Date  . Medical history non-contributory     Past Surgical History: Past Surgical History:  Procedure Laterality Date  . NO PAST SURGERIES      Obstetrical History: OB History    Gravida Para Term Preterm AB Living   1 0 0 0 0 0   SAB TAB Ectopic Multiple Live Births   0 0 0 0 0      Social History: Social History   Socioeconomic History  . Marital status: Single    Spouse name: None  . Number of children: None  . Years of education: None  . Highest education level: None  Social Needs  . Financial resource strain: None  . Food insecurity - worry: None  . Food insecurity - inability: None  . Transportation needs - medical: None  . Transportation needs - non-medical: None  Occupational History  . None  Tobacco Use  . Smoking status: Never Smoker  . Smokeless tobacco: Never Used  Substance and Sexual Activity  . Alcohol use: No  . Drug use: No  . Sexual activity: Not Currently    Birth control/protection: None  Other Topics Concern  . None  Social History Narrative  . None    Family History: Family History  Problem Relation Age of Onset  . Diabetes Maternal Grandmother     Allergies: No Known Allergies  Medications Prior to Admission  Medication Sig Dispense Refill Last Dose  . Prenatal Vit-Fe  Fumarate-FA (PRENATAL MULTIVITAMIN) TABS tablet Take 1 tablet by mouth daily at 12 noon.   10/07/2017 at Unknown time     Review of Systems  All systems reviewed and negative except as stated in HPI  Physical Exam Blood pressure 119/63, pulse (!) 101, temperature 98.2 F (36.8 C), temperature source Oral, resp. rate 20, height 5\' 2"  (1.575 m), weight 151 lb (68.5 kg), last menstrual period 12/02/2016, SpO2 100 %. General appearance: alert, cooperative and no distress Lungs: clear to auscultation bilaterally Heart: regular rate and rhythm Abdomen: soft, non-tender; bowel sounds normal Extremities: No calf swelling or tenderness Presentation: cephalic Fetal monitoring: 140bpm, moderate variability, accelerations present, no decelerations Uterine activity: irregular, ctx every 3-5 minutes Dilation: 4 Effacement (%): 90 Station: -1 Exam by:: Exie ParodyA. Gagliardo, RN  Prenatal labs: ABO, Rh: --/--/O POS (11/07 1543) Antibody: NEG (11/07 1543) Rubella: Immune (05/17 0000) RPR: Nonreactive (05/17 0000)  HBsAg: Negative (05/17 0000)  HIV: Non-reactive (05/17 0000)  GC/Chlamydia: negative GBS: Negative (10/11 0000)  1 hr Glucola: 116 Genetic screening:  normal Anatomy US: last U/s normal; previous ones did show small for gestational age  Prenatal Transfer Tool  Maternal Diabetes: No Genetic Screening: Normal Maternal Ultrasounds/Referrals: Normal Fetal Ultrasounds or other Referrals:  None Maternal Substance Abuse:  No Significant Maternal Medications:  None Significant Maternal Lab Results: None  Results for orders placed or performed during the  hospital encounter of 10/08/17 (from the past 24 hour(s))  CBC   Collection Time: 10/08/17  3:43 PM  Result Value Ref Range   WBC 11.2 (H) 4.0 - 10.5 K/uL   RBC 3.94 3.87 - 5.11 MIL/uL   Hemoglobin 11.9 (L) 12.0 - 15.0 g/dL   HCT 40.934.5 (L) 81.136.0 - 91.446.0 %   MCV 87.6 78.0 - 100.0 fL   MCH 30.2 26.0 - 34.0 pg   MCHC 34.5 30.0 - 36.0 g/dL    RDW 78.214.5 95.611.5 - 21.315.5 %   Platelets 215 150 - 400 K/uL  Type and screen [facility]   Collection Time: 10/08/17  3:43 PM  Result Value Ref Range   ABO/RH(D) O POS    Antibody Screen NEG    Sample Expiration 10/11/2017     Patient Active Problem List   Diagnosis Date Noted  . Indication for care in labor or delivery 10/08/2017  . Encounter for supervision of normal first pregnancy in third trimester 08/27/2017    Assessment: Alexandria Barrena Alexandria Harewood is a 21 y.o. G1P0000 at 6329w6d here for SOL.  #Labor: Contractions are 3-5 minutes apart, will continue to monitor for adequate change and regular contractions. Consider Pitocin if labor does not progress. #Pain: Epidural #FWB: Cat I #ID:  GBS neg #MOF: breast #MOC: none #Circ:  N/a; girl  Arlyce Harmanimothy Lockamy 10/08/2017, 4:47 PM  I confirm that I have verified the information documented in the resident's note and that I have also personally reperformed the physical exam and all medical decision making activities.   Luna KitchensKathryn Shahad Mazurek CNM

## 2017-10-08 NOTE — Progress Notes (Signed)
   Alexandria [name] is a 21 y.o. G1P0000 at 2446w6d  admitted for active labor  Subjective:   Objective: Vitals:   10/08/17 1448 10/08/17 1608 10/08/17 1702  BP: (!) 100/50 119/63 (!) 112/59  Pulse: 88 (!) 101 80  Resp: 17 20   Temp: 98.3 F (36.8 C) 98.2 F (36.8 C)   TempSrc: Oral Oral   SpO2: 100%    Weight: 151 lb (68.5 kg)    Height: 5\' 2"  (1.575 m)     No intake/output data recorded.  FHT:  FHR: 135 bpm, variability: moderate,  accelerations:  Abscent,  decelerations:  Absent UC:   irregular, every 2-3 minutes SVE:   Dilation: 4 Effacement (%): 90 Station: -1 Exam by:: AMariel Sleet. Gagliardo, RN   Labs: Lab Results  Component Value Date   WBC 11.2 (H) 10/08/2017   HGB 11.9 (L) 10/08/2017   HCT 34.5 (L) 10/08/2017   MCV 87.6 10/08/2017   PLT 215 10/08/2017    Assessment / Plan: Spontaneous labor, progressing normally  Labor: Progressing normally Fetal Wellbeing:  Reassuring but not reactive in the past 30 minutes.  Pain Control:  IV pain meds Anticipated MOD:  NSVD  Charlesetta GaribaldiKathryn Lorraine Kooistra CNM 10/08/2017, 6:09 PM

## 2017-10-08 NOTE — Progress Notes (Signed)
Patient to be discharged from MAU as she is not in labor.  She is to go to [facility] from here for her scheduled US per Jobe Gibbonaroline O'Neill, CNM.

## 2017-10-08 NOTE — MAU Note (Signed)
Patient states contractions started around 0800 and are coming every 5-6 minutes.  Denies vaginal bleeding or LOF. Reports good fetal movement.

## 2017-10-08 NOTE — Discharge Instructions (Signed)
Fetal Movement Counts °Patient Name: ________________________________________________ Patient Due Date: ____________________ °What is a fetal movement count? °A fetal movement count is the number of times that you feel your baby move during a certain amount of time. This may also be called a fetal kick count. A fetal movement count is recommended for every pregnant woman. You may be asked to start counting fetal movements as early as week 28 of your pregnancy. °Pay attention to when your baby is most active. You may notice your baby's sleep and wake cycles. You may also notice things that make your baby move more. You should do a fetal movement count: °· When your baby is normally most active. °· At the same time each day. ° °A good time to count movements is while you are resting, after having something to eat and drink. °How do I count fetal movements? °1. Find a quiet, comfortable area. Sit, or lie down on your side. °2. Write down the date, the start time and stop time, and the number of movements that you felt between those two times. Take this information with you to your health care visits. °3. For 2 hours, count kicks, flutters, swishes, rolls, and jabs. You should feel at least 10 movements during 2 hours. °4. You may stop counting after you have felt 10 movements. °5. If you do not feel 10 movements in 2 hours, have something to eat and drink. Then, keep resting and counting for 1 hour. If you feel at least 4 movements during that hour, you may stop counting. °Contact a health care provider if: °· You feel fewer than 4 movements in 2 hours. °· Your baby is not moving like he or she usually does. °Date: ____________ Start time: ____________ Stop time: ____________ Movements: ____________ °Date: ____________ Start time: ____________ Stop time: ____________ Movements: ____________ °Date: ____________ Start time: ____________ Stop time: ____________ Movements: ____________ °Date: ____________ Start time:  ____________ Stop time: ____________ Movements: ____________ °Date: ____________ Start time: ____________ Stop time: ____________ Movements: ____________ °Date: ____________ Start time: ____________ Stop time: ____________ Movements: ____________ °Date: ____________ Start time: ____________ Stop time: ____________ Movements: ____________ °Date: ____________ Start time: ____________ Stop time: ____________ Movements: ____________ °Date: ____________ Start time: ____________ Stop time: ____________ Movements: ____________ °This information is not intended to replace advice given to you by your health care provider. Make sure you discuss any questions you have with your health care provider. °Document Released: 12/18/2006 Document Revised: 07/17/2016 Document Reviewed: 12/28/2015 °Elsevier Interactive Patient Education © 2018 [employer] °Braxton Hicks Contractions °Contractions of the uterus can occur throughout pregnancy, but they are not always a sign that you are in labor. You may have practice contractions called Braxton Hicks contractions. These false labor contractions are sometimes confused with true labor. °What are Braxton Hicks contractions? °Braxton Hicks contractions are tightening movements that occur in the muscles of the uterus before labor. Unlike true labor contractions, these contractions do not result in opening (dilation) and thinning of the cervix. Toward the end of pregnancy (32-34 weeks), Braxton Hicks contractions can happen more often and may become stronger. These contractions are sometimes difficult to tell apart from true labor because they can be very uncomfortable. You should not feel embarrassed if you go to the hospital with false labor. °Sometimes, the only way to tell if you are in true labor is for your health care provider to look for changes in the cervix. The health care provider will do a physical exam and may monitor your contractions. If   you are not in true labor, the exam  should show that your cervix is not dilating and your water has not broken. °If there are no prenatal problems or other health problems associated with your pregnancy, it is completely safe for you to be sent home with false labor. You may continue to have Braxton Hicks contractions until you go into true labor. °How can I tell the difference between true labor and false labor? °· Differences °? False labor °? Contractions last 30-70 seconds.: Contractions are usually shorter and not as strong as true labor contractions. °? Contractions become very regular.: Contractions are usually irregular. °? Discomfort is usually felt in the top of the uterus, and it spreads to the lower abdomen and low back.: Contractions are often felt in the front of the lower abdomen and in the groin. °? Contractions do not go away with walking.: Contractions may go away when you walk around or change positions while lying down. °? Contractions usually become more intense and increase in frequency.: Contractions get weaker and are shorter-lasting as time goes on. °? The cervix dilates and gets thinner.: The cervix usually does not dilate or become thin. °Follow these instructions at home: °· Take over-the-counter and prescription medicines only as told by your health care provider. °· Keep up with your usual exercises and follow other instructions from your health care provider. °· Eat and drink lightly if you think you are going into labor. °· If Braxton Hicks contractions are making you uncomfortable: °? Change your position from lying down or resting to walking, or change from walking to resting. °? Sit and rest in a tub of warm water. °? Drink enough fluid to keep your urine clear or pale yellow. Dehydration may cause these contractions. °? Do slow and deep breathing several times an hour. °· Keep all follow-up prenatal visits as told by your health care provider. This is important. °Contact a health care provider if: °· You have a  fever. °· You have continuous pain in your abdomen. °Get help right away if: °· Your contractions become stronger, more regular, and closer together. °· You have fluid leaking or gushing from your vagina. °· You pass blood-tinged mucus (bloody show). °· You have bleeding from your vagina. °· You have low back pain that you never had before. °· You feel your baby’s head pushing down and causing pelvic pressure. °· Your baby is not moving inside you as much as it used to. °Summary °· Contractions that occur before labor are called Braxton Hicks contractions, false labor, or practice contractions. °· Braxton Hicks contractions are usually shorter, weaker, farther apart, and less regular than true labor contractions. True labor contractions usually become progressively stronger and regular and they become more frequent. °· Manage discomfort from Braxton Hicks contractions by changing position, resting in a warm bath, drinking plenty of water, or practicing deep breathing. °This information is not intended to replace advice given to you by your health care provider. Make sure you discuss any questions you have with your health care provider. °Document Released: 11/18/2005 Document Revised: 10/07/2016 Document Reviewed: 10/07/2016 °Elsevier Interactive Patient Education © 2017 Elsevier Inc. ° °

## 2017-10-09 ENCOUNTER — Encounter (HOSPITAL_COMMUNITY): Payer: Self-pay

## 2017-10-09 ENCOUNTER — Encounter: Payer: Self-pay | Admitting: Student

## 2017-10-09 LAB — RPR: RPR Ser Ql: NONREACTIVE

## 2017-10-09 MED ORDER — IBUPROFEN 600 MG PO TABS
600.0000 mg | ORAL_TABLET | Freq: Four times a day (QID) | ORAL | Status: DC
  Administered 2017-10-09 – 2017-10-10 (×6): 600 mg via ORAL
  Filled 2017-10-09 (×5): qty 1

## 2017-10-09 MED ORDER — TETANUS-DIPHTH-ACELL PERTUSSIS 5-2.5-18.5 LF-MCG/0.5 IM SUSP
0.5000 mL | Freq: Once | INTRAMUSCULAR | Status: DC
Start: 2017-10-09 — End: 2017-10-10

## 2017-10-09 MED ORDER — ACETAMINOPHEN 325 MG PO TABS
650.0000 mg | ORAL_TABLET | ORAL | Status: DC | PRN
  Administered 2017-10-09 (×2): 650 mg via ORAL
  Filled 2017-10-09: qty 2

## 2017-10-09 MED ORDER — SIMETHICONE 80 MG PO CHEW: 80.0000 mg | CHEWABLE_TABLET | ORAL | Status: DC | PRN

## 2017-10-09 MED ORDER — WITCH HAZEL-GLYCERIN EX PADS: 1.0000 "application " | MEDICATED_PAD | CUTANEOUS | Status: DC | PRN

## 2017-10-09 MED ORDER — DIBUCAINE 1 % RE OINT: 1.0000 "application " | TOPICAL_OINTMENT | RECTAL | Status: DC | PRN

## 2017-10-09 MED ORDER — SENNOSIDES-DOCUSATE SODIUM 8.6-50 MG PO TABS
2.0000 | ORAL_TABLET | ORAL | Status: DC
  Administered 2017-10-09: 2 via ORAL
  Filled 2017-10-09: qty 2

## 2017-10-09 MED ORDER — BENZOCAINE-MENTHOL 20-0.5 % EX AERO
1.0000 "application " | INHALATION_SPRAY | CUTANEOUS | Status: DC | PRN
  Administered 2017-10-09: 1 via TOPICAL
  Filled 2017-10-09: qty 56

## 2017-10-09 MED ORDER — ONDANSETRON HCL 4 MG/2ML IJ SOLN
4.0000 mg | INTRAMUSCULAR | Status: DC | PRN
Start: 2017-10-09 — End: 2017-10-10

## 2017-10-09 MED ORDER — DIPHENHYDRAMINE HCL 25 MG PO CAPS: 25.0000 mg | ORAL_CAPSULE | Freq: Four times a day (QID) | ORAL | Status: DC | PRN

## 2017-10-09 MED ORDER — ONDANSETRON HCL 4 MG PO TABS: 4.0000 mg | ORAL_TABLET | ORAL | Status: DC | PRN

## 2017-10-09 MED ORDER — ZOLPIDEM TARTRATE 5 MG PO TABS: 5.0000 mg | ORAL_TABLET | Freq: Every evening | ORAL | Status: DC | PRN

## 2017-10-09 MED ORDER — COCONUT OIL OIL
1.0000 "application " | TOPICAL_OIL | Status: DC | PRN
  Administered 2017-10-09: 1 via TOPICAL
  Filled 2017-10-09: qty 120

## 2017-10-09 MED ORDER — PRENATAL MULTIVITAMIN CH
1.0000 | ORAL_TABLET | Freq: Every day | ORAL | Status: DC
  Administered 2017-10-09 – 2017-10-10 (×2): 1 via ORAL
  Filled 2017-10-09 (×2): qty 1

## 2017-10-09 NOTE — Anesthesia Postprocedure Evaluation (Signed)
Anesthesia Post Note  Patient: Alexandria [name]  Procedure(s) Performed: AN AD HOC LABOR EPIDURAL     Patient location during evaluation: Mother Baby Anesthesia Type: Epidural Level of consciousness: awake and alert and oriented Pain management: satisfactory to patient Vital Signs Assessment: post-procedure vital signs reviewed and stable Respiratory status: spontaneous breathing and nonlabored ventilation Cardiovascular status: stable Postop Assessment: no headache, no backache, no signs of nausea or vomiting, adequate PO intake and patient able to bend at knees (patient up walking) Anesthetic complications: no    Last Vitals:  Vitals:   10/09/17 0539 10/09/17 0814  BP: (!) 104/46 (!) 105/48  Pulse: 79 75  Resp: 18 20  Temp:  36.9 C  SpO2:      Last Pain:  Vitals:   10/09/17 0815  TempSrc:   PainSc: 0-No pain   Pain Goal:                 Madison HickmanGREGORY,Mayra Jolliffe

## 2017-10-09 NOTE — Progress Notes (Signed)
Post Partum Day 1 Subjective: no complaints, up ad lib, voiding, tolerating PO and + flatus  Objective: Blood pressure (!) 105/48, pulse 75, temperature 98.4 F (36.9 C), temperature source Oral, resp. rate 20, height 5\' 2"  (1.575 m), weight 151 lb (68.5 kg), last menstrual period 12/02/2016, SpO2 100 %, unknown if currently breastfeeding.  Physical Exam:  General: alert, cooperative and no distress Lochia: appropriate Uterine Fundus: firm Incision: n/a DVT Evaluation: No evidence of DVT seen on physical exam. No cords or calf tenderness.  Recent Labs    10/08/17 1543  HGB 11.9*  HCT 34.5*    Assessment/Plan: Plan for discharge tomorrow, Breastfeeding and Contraception none, declines Nexplanon. I did inform patient the health benefits for her and future pregnancies for waiting at least one year before getting pregnant again   LOS: 1 day   Arlyce Harmanimothy Lockamy 10/09/2017, 8:54 AM   Patient was seen and examined by me also Agree with note Vitals stable Labs stable Fundus firm, lochia within normal limits Perineum healing Ext WNL Continue care Anticipate discharge tomorrow  Aviva SignsWilliams, Sommer Spickard L, CNM

## 2017-10-09 NOTE — Lactation Note (Signed)
This note was copied from a baby's chart. Lactation Consultation Note: Mom reports baby last fed about 1 hour ago, Baby awake and fussy in bassinet. Offered assist with latch and mom agreeable. Assisted mom with football hold. Reports this is the first time she has used this position. Baby on and off the breast- fed for 5 min then off to sleep.  Placed skin to skin with mom. Reviewed feeding cues and encouraged to feed whenever she sees them. Mom asking about formula. Reviewed importance of frequent nursing to promote a good milk supply. Encouragement given. Bf brochure given. Reviewed out phone number, OP appointments and BFSG as resources for support after DC. No further questions at present. To call prn  Patient Name: Girl Janyth Pupana Sinagra FAOZH'YToday's Date: 10/09/2017 Reason for consult: Initial assessment   Maternal Data Formula Feeding for Exclusion: No Has patient been taught Hand Expression?: Yes Does the patient have breastfeeding experience prior to this delivery?: No  Feeding Feeding Type: Breast Fed Length of feed: 5 min  LATCH Score Latch: Repeated attempts needed to sustain latch, nipple held in mouth throughout feeding, stimulation needed to elicit sucking reflex.  Audible Swallowing: A few with stimulation  Type of Nipple: Everted at rest and after stimulation  Comfort (Breast/Nipple): Soft / non-tender  Hold (Positioning): Assistance needed to correctly position infant at breast and maintain latch.  LATCH Score: 7  Interventions Interventions: Breast feeding basics reviewed;Assisted with latch;Position options;Support pillows;Hand express  Lactation Tools Discussed/Used     Consult Status Consult Status: Follow-up Date: 10/10/17 Follow-up type: In-patient    Pamelia HoitWeeks, Yazen Rosko D 10/09/2017, 10:14 AM

## 2017-10-10 MED ORDER — IBUPROFEN 600 MG PO TABS: 600.0000 mg | ORAL_TABLET | Freq: Four times a day (QID) | ORAL | 0 refills | Status: DC

## 2017-10-10 MED ORDER — SENNOSIDES-DOCUSATE SODIUM 8.6-50 MG PO TABS: 2.0000 | ORAL_TABLET | ORAL | 0 refills | Status: DC

## 2017-10-10 NOTE — Discharge Instructions (Signed)
Instrucciones para la mam sobre los cuidados en el hogar (Home Care Instructions for Mom) ACTIVIDAD Reanude sus actividades regulares de forma gradual. Descanse. Tome siestas cuando el beb duerme. No levante objetos que pesen ms de 10libras (4,5kg) hasta que el mdico se lo autorice. Evite las actividades que demandan mucho esfuerzo y Engineer, drilling (que son extenuantes) hasta que el mdico se lo autorice. Caminar a un ritmo tranquilo a moderado siempre es ms seguro. Si tuvo un parto por cesrea: No pase la aspiradora, suba escaleras o conduzca un vehculo durante 4 o 6 semanas. Pdale a alguien que le brinde ayuda con las tareas [employer] que pueda realizarlas por su cuenta. Haga ejercicios como se lo haya indicado el mdico, si corresponde.  HEMORRAGIA VAGINAL Probablemente contine sangrando durante 4 o 6 semanas despus del parto. Generalmente, la cantidad de sangre disminuye y el color se hace ms claro con el transcurso del [place]. Sin embargo, si usted est [employer], el color de la sangre puede ser rojo brillante. Si necesita cambiarse la compresa higinica en menos de una hora o tiene cogulos grandes: Permanezca acostada. Eleve los pies. Coloque compresas fras en la zona inferior del abdomen. Haga reposo. Comunquese con su mdico. Si est amamantando, podra volver a tener su perodo [place] 8 semanas despus del parto y el momento en que deje de Museum/gallery exhibitions officer. Si no est amamantando, volver a tener su perodo 6 u 8 semanas despus del [place]. CUIDADOS PERINEALES La zona perineal o perineo, es la parte del cuerpo que se encuentra entre los muslos. Despus del parto, esta zona necesita un cuidado especial. Siga las siguientes indicaciones como se lo haya indicado su mdico. Tome baos de inmersin durante 15 o 20 minutos. Utilice apsitos o [employer] Engineering y Product/process development scientist se lo hayan indicado. No utilice tampones ni se haga duchas vaginales hasta que el sangrado  vaginal se haya detenido. Cada vez que vaya al bao: Use una botella perineal. Cmbiese el apsito. Use papel tis en lugar de papel higinico hasta que se cure la sutura. Haga ejercicios de [employer]. Los ejercicios Kegel ayudan a Pharmacologist los msculos que sostienen la vagina, la vejiga y los intestinos. Estos ejercicios se pueden Ecologist est parada, sentada o acostada. Para hacer los ejercicios de Kegel: Tense los msculos del estmago y los que rodean el canal de [place]. Mantenga esta posicin durante unos segundos. Reljese. Repita hasta hacerlos 5 veces seguidas. Para evitar las hemorroides o que estas empeoren: Beba suficiente lquido para Pharmacologist la orina clara o de color amarillo plido. Evite hacer fuerza al defecar. Tome los [employer] y laxantes de venta libre como se lo haya indicado el mdico. CUIDADO DE LAS MAMAS Use un buen sostn. Evite tomar analgsicos de venta libre para las [employer] Computer. Aplique hielo en los pechos para Paramedic las molestias tanto como sea necesario: Ponga el hielo en una bolsa plstica. Coloque una toalla entre la piel y la bolsa de hielo. Aplique el hielo durante 20, o como se lo haya indicado el mdico.  NUTRICIN Mantenga una dieta bien balanceada. No intente perder de peso rpidamente reduciendo el consumo de caloras. Tome sus vitaminas prenatales hasta el control de postparto o hasta que su mdico se lo indique.  DEPRESIN POSTPARTO Puede sentir deseos de llorar sin motivo aparente y verse incapaz de enfrentarse a todos los cambios que implica tener un beb. Este estado de nimo se llama depresin postparto. La depresin postparto ocurre porque sus niveles hormonales  sufren cambios despus del parto. Si usted tiene depresin postparto, busque contencin por parte de su pareja, sus amigos y su familia. Si la depresin no desaparece por s sola despus de algunas semanas, concurra a su mdico. AUTOEXAMEN DE  MAMAS Realcese autoexmenes en el mismo momento cada mes. Si est amamantando, el mejor momento de controlar sus mamas es despus de Corporate treasureralimentar al beb, cuando los pechos no estn tan llenos. Si est amamantando y su perodo ya comenz, controle sus mamas el da 5, 6 o 7 de su perodo. Informe a su mdico de cualquier protuberancia, bulto o secrecin. Si est amamantando, las Hovnanian Enterprisesmamas normalmente tienen bultos. Esto es transitorio y no es un riesgo para Radiographer, therapeuticla salud. INTIMIDAD Y SEXUALIDAD Debe evitar las relaciones sexuales durante al menos 3 o 4 semanas despus del parto o hasta que el flujo de color rojo amarronado haya desaparecido completamente. Si no desea quedar embarazada nuevamente, use algn mtodo anticonceptivo. Despus del parto, puede quedar embarazada incluso si no ha tenido todava el perodo. SOLICITE ATENCIN MDICA SI: Se siente incapaz de controlar los cambios que implica tener un hijo y esos sentimientos no desaparecen despus de Armed forces technical officeralgunas semanas. Detecta una protuberancia, bulto o secrecin en sus mamas.  SOLICITE ATENCIN MDICA DE INMEDIATO SI: Debe cambiarse la compresa higinica en 1 hora o menos. Tiene los siguientes sntomas: Dolor intenso o calambres en la parte inferior del abdomen. Una secrecin vaginal con mal olor. Fiebre que no se BJ'salivia con los medicamentos. Una zona de la mama se pone roja y Presenter, broadcastingle causa dolor, y adems usted tiene fiebre. Una pantorrilla enrojecida y con dolor. Repentino e intenso dolor en el pecho. Falta de aire. Miccin dolorosa o con sangre. Problemas visuales. Vmitos durante 12 horas o ms. Dolor de cabeza intenso. Tiene pensamientos serios acerca de lastimarse a usted misma o daar al nio o a otra persona.  Esta informacin no tiene Theme park managercomo fin reemplazar el consejo del mdico. Asegrese de hacerle al mdico cualquier pregunta que tenga. Document Released: 11/18/2005 Document Revised: 03/11/2016 Document Reviewed: 05/22/2015 Elsevier  Interactive Patient Education  2017 [employer]vier [employer]. Parto vaginal, cuidados posteriores (Vaginal Delivery, Care After) Siga estas instrucciones durante las prximas semanas. Estas indicaciones le proporcionan informacin acerca de cmo deber cuidarse despus del parto. El mdico tambin podr darle instrucciones ms especficas. El tratamiento ha sido planificado segn las prcticas mdicas actuales, pero en algunos casos pueden ocurrir problemas. Llame al mdico si tiene problemas o preguntas. QU ESPERAR DESPUS DEL PARTO Despus de un parto vaginal, es frecuente tener lo siguiente:  Hemorragia leve de la vagina.  Dolor en el abdomen, la vagina y la zona de la piel entre la abertura vaginal y el ano (perineo).  Calambres plvicos.  Fatiga. INSTRUCCIONES PARA EL CUIDADO EN EL HOGAR Medicamentos  Baxter Internationalome los medicamentos de venta libre y los recetados solamente como se lo haya indicado el mdico.  Si le recetaron un antibitico, tmelo como se lo haya indicado el mdico. No interrumpa la administracin del antibitico hasta que lo haya terminado. Conducir  No conduzca ni opere maquinaria pesada mientras toma analgsicos recetados.  No conduzca durante 24horas si le administraron un sedante. Estilo de vida  No beba alcohol. Esto es de suma importancia si est amamantando o toma analgsicos.  No consuma productos que contengan tabaco, incluidos cigarrillos, tabaco de Theatre managermascar o cigarrillos electrnicos. Si necesita ayuda para dejar de fumar, consulte al mdico. Comida y bebida  Beba al menos 8vasos de 8onzas (240cc) de agua todos los  das a menos que el mdico le indique lo contrario. Si elige amamantar al beb, quiz deba beber an ms cantidad de agua.  Ingiera alimentos ricos en Enbridge Energyfibras todos los das. Estos alimentos pueden ayudarla a prevenir o Educational psychologistaliviar el estreimiento. Los alimentos ricos en fibras incluyen, entre otros: ? Panes y cereales integrales. ? Arroz  integral. ? Armed forces operational officerrijoles. ? Nils PyleFrutas y verduras frescas. Actividad  Reanude sus actividades normales como se lo haya indicado el mdico. Pregntele al mdico qu actividades son seguras para usted.  Descanse todo lo que pueda. Trate de descansar o tomar una siesta mientras el beb est durmiendo.  No levante objetos que pesen ms de 10libras (4,5kg) hasta que el mdico le diga que es seguro Dinosaurhacerlo.  Hable con el mdico sobre cundo puede volver a Management consultanttener relaciones sexuales. Esto puede depender de lo siguiente: ? Riesgo de sufrir infecciones. ? Velocidad de cicatrizacin. ? Comodidad y deseo de Management consultanttener relaciones sexuales. Cuidados vaginales  Si le realizaron una episiotoma o tuvo un desgarro vaginal, contrlese la zona todos los Backusdas para detectar signos de infeccin. Est atenta a los siguientes signos: ? Aumento del enrojecimiento, la hinchazn o Chief Technology Officerel dolor. ? Ms lquido [place] Janskyo sangre. ? Calor. ? Pus o mal olor.  No use tampones ni se haga duchas vaginales hasta que el mdico la autorice.  Controle la sangre que elimina por la vagina para detectar cogulos. Pueden tener el aspecto de grumos de color rojo oscuro, marrn o negro. Instrucciones generales  Mantenga el perineo limpio y seco, como se lo haya indicado el mdico.  Use ropa cmoda y suelta.  Cuando vaya al bao, siempre higiencese de adelante Du Ponthacia atrs.  Pregntele al mdico si puede ducharse o tomar baos de inmersin. Si se le realiz una episiotoma o tuvo un desgarro perineal durante el trabajo del parto o el parto, es posible que el mdico le indique que no tome baos de inmersin durante un determinado tiempo.  Use un sostn que sujete y ajuste bien sus pechos.  Si es posible, pdale a alguien que la ayude con las tareas del hogar y a Scientist, product/process developmentcuidar del beb durante al menos algunos das despus de salir del hospital.  Chauncy PassyConcurra a todas las visitas de seguimiento para usted y el beb, como se lo haya indicado el mdico. Esto es  importante. SOLICITE ATENCIN MDICA SI:  Tiene los siguientes sntomas: ? Secrecin vaginal que tiene mal olor. ? Dificultad para orinar. ? Dolor al ConocoPhillipsorinar. ? Aumento o disminucin repentinos de la frecuencia con que defeca. ? Ms enrojecimiento, hinchazn o dolor alrededor de la episiotoma o del desgarro vaginal. ? Ms secrecin de lquido o sangre de la episiotoma o desgarro vaginal. ? Pus o mal olor proveniente de la episiotoma o el desgarro vaginal. ? Grant RutsFiebre. ? Erupcin cutnea. ? Poco inters o falta de inters en actividades que solan gustarle. ? Dudas sobre su cuidado y el del beb.  Siente la episiotoma o el desgarro vaginal caliente al tacto.  La episiotoma o el desgarro vaginal se est abriendo o no Adult nurseparece cicatrizar.  Siente dolor en las [place] Jeanmamas, o estn duras o enrojecidas.  Siente tristeza o preocupacin de forma inusual.  Siente nuseas o vomita.  Elimina cogulos grandes por la vagina. Si expulsa un cogulo sanguneo por la vagina, gurdelo para mostrrselo a su mdico. No tire la cadena sin que el mdico examine el cogulo antes.  Orina ms de lo habitual.  Se siente mareada o se desmaya.  No ha amamantado para  nada y no ha tenido un perodo menstrual durante 12 semanas despus del [place].  Dej de amamantar al beb y no ha tenido su perodo menstrual durante 12 semanas despus de dejar de Museum/gallery exhibitions officer.  SOLICITE ATENCIN MDICA DE INMEDIATO SI:  Tiene los siguientes sntomas: ? Dolor que no desaparece o no mejora con el medicamento. ? Journalist, newspaper. ? Dificultad para respirar. ? Visin borrosa o Nurse, adult. ? Pensamientos de autolesionarse o lesionar al beb.  Comienza a Psychiatrist abdomen o en una de las piernas.  Dolor de cabeza intenso.  Se desmaya.  Tiene una hemorragia tan intensa de la vagina que empapa dos toallitas sanitarias en [place].  Esta informacin no tiene Theme park manager el consejo del mdico. Asegrese  de hacerle al mdico cualquier pregunta que tenga. Document Released: 11/18/2005 Document Revised: 03/11/2016 Document Reviewed: 12/03/2015 Elsevier Interactive Patient Education  2017 [employer].

## 2017-10-10 NOTE — Discharge Summary (Signed)
OB Discharge Summary     Patient Name: Alexandria [name] Viviana Noda DOB: 01/28/1996 MRN: [id number]  Date of admission: 10/08/2017 Delivering MD: Suella BroadMINOTT, KERIANN S   Date of discharge: 10/10/2017  Admitting diagnosis: LABOR Intrauterine pregnancy: 2164w6d     Secondary diagnosis:  Active Problems:   Indication for care in labor or delivery   Indication for care in labor and delivery, antepartum   SVD (spontaneous vaginal delivery)  Additional problems: Patient Active Problem List   Diagnosis Date Noted  . Indication for care in labor or delivery 10/08/2017  . Indication for care in labor and delivery, antepartum 10/08/2017  . SVD (spontaneous vaginal delivery) 10/08/2017  . Encounter for supervision of normal first pregnancy in third trimester 08/27/2017       Discharge diagnosis: Term Pregnancy Delivered                                                                                                Post partum procedures:none  Augmentation: AROM  Complications: None  Hospital course:  Onset of Labor With Vaginal Delivery     21 y.o. yo G1P1001 at 6664w6d was admitted in Latent Labor on 10/08/2017. Patient had an uncomplicated labor course as follows:  Membrane Rupture Time/Date: 9:39 PM ,10/08/2017   Intrapartum Procedures: Episiotomy: None [1]                                         Lacerations:  Labial [10]  Patient had a delivery of a Viable infant. 10/08/2017  Information for the patient's newborn:  Adelene Idlerlvaradosolorzano, Girl Darien Ramusna [[id number]][[id number]]  Delivery Method: Vaginal, Spontaneous(Filed from Delivery Summary)    Pateint had an uncomplicated postpartum course.  She is ambulating, tolerating a regular diet, passing flatus, and urinating well. Patient is discharged home in stable condition on 10/10/17.   Physical exam  Vitals:   10/09/17 0814 10/09/17 1643 10/09/17 1846 10/10/17 0524  BP: (!) 105/48 (!) 102/49  (!) 94/42  Pulse: 75 77  71  Resp: 20 18  18   Temp: 98.4 F  (36.9 C)  99.2 F (37.3 C) 98.3 F (36.8 C)  TempSrc: Oral  Oral Oral  SpO2:  99%    Weight:      Height:       General: alert, cooperative and no distress Lochia: appropriate Uterine Fundus: firm Incision: N/A DVT Evaluation: No evidence of DVT seen on physical exam. Labs: Lab Results  Component Value Date   WBC 11.2 (H) 10/08/2017   HGB 11.9 (L) 10/08/2017   HCT 34.5 (L) 10/08/2017   MCV 87.6 10/08/2017   PLT 215 10/08/2017   No flowsheet data found.  Discharge instruction: per After Visit Summary and "Baby and Me Booklet".  After visit meds:  Allergies as of 10/10/2017   No Known Allergies     Medication List    TAKE these medications   ibuprofen 600 MG tablet Commonly known as:  ADVIL,MOTRIN Take 1 tablet (600 mg total) every 6 (six) hours by mouth.  prenatal multivitamin Tabs tablet Take 1 tablet by mouth daily at 12 noon.   senna-docusate 8.6-50 MG tablet Commonly known as:  Senokot-S Take 2 tablets daily by mouth. Start taking on:  10/11/2017       Diet: routine diet  Activity: Advance as tolerated. Pelvic rest for 6 weeks.   Outpatient follow up:4 weeks Follow up Appt:No future appointments. Follow up Visit:No Follow-up on file.  Postpartum contraception: None  Newborn Data: Live born female  Birth Weight: 6 lb 14.8 oz (3141 g) APGAR: 9, 9  Newborn Delivery   Birth date/time:  10/08/2017 22:49:00 Delivery type:  Vaginal, Spontaneous     Baby Feeding: Breast Disposition:home with mother   10/10/2017 Suella BroadKeriann S Minott, MD  OB FELLOW DISCHARGE ATTESTATION  I have seen and examined this patient and agree with above documentation in the resident's note.   Frederik PearJulie P Kariem Wolfson, MD OB Fellow 5:47 PM

## 2017-10-10 NOTE — Lactation Note (Signed)
This note was copied from a baby's chart. Lactation Consultation Note  Patient Name: Girl Janyth Pupana Landau PPIRJ'JToday's Date: 10/10/2017  Pecola LeisureBaby is not consistently latching well and had a delayed void.  Mom states she plans on pumping and bottle feeding expressed milk/formula.  She has a pump for home use.  I reviewed importance of pumping 8-12 times/24 hours to promote and maintain milk supply.  I offered assist in latching baby this AM but mom refused.  Lactation outpatient services and support reviewed and encouraged.   Maternal Data    Feeding    LATCH Score                   Interventions    Lactation Tools Discussed/Used     Consult Status      Huston FoleyMOULDEN, Dacy Enrico S 10/10/2017, 8:41 AM

## 2017-10-11 ENCOUNTER — Ambulatory Visit: Payer: Self-pay

## 2017-10-11 NOTE — Lactation Note (Signed)
This note was copied from a baby's chart. Lactation Consultation Note; Mom reports baby last fed at 5 am. Offered assist with latch- mom refused does not want to latch at this time. Mom has 19 ml EBM at bedside- fed to baby with bottle. Mom offered formula- baby took 3 ml and off to sleep. Mom pumping as I left room. No questions at present. Reports she did try NS once but did not like it and does not plan to use it again. States Alexandria [name] bought a DEBP for her to use at home. Reviewed our phone number, OP appointments and BFSG as resources for support after DC.   Patient Name: [name] ZOXWR'UToday's Date: 10/11/2017 Reason for consult: Follow-up assessment   Maternal Data Formula Feeding for Exclusion: No Has patient been taught Hand Expression?: Yes Does the patient have breastfeeding experience prior to this delivery?: No  Feeding    LATCH Score                   Interventions    Lactation Tools Discussed/Used Tools: Pump;Coconut oil;Nipple Shields Nipple shield size: 16 Breast pump type: Double-Electric Breast Pump WIC Program: Yes   Consult Status Consult Status: Complete    Alexandria HoitWeeks, Alexandria [name] 10/11/2017, 9:16 AM

## 2017-10-11 NOTE — Lactation Note (Addendum)
This note was copied from a baby's chart. Lactation Consultation Note Asked mom if needed interpreter, mom stated she she understands fine.  Mom has been pumping and bottle feeding. Mom pumping 10 ml colostrum giving to baby. Discussed w/mom that if baby wasn't going to breast supplementing and amount wasn't enough at hours of age. Gave mom supplementation information sheet that if baby isn't going to breast, baby needs 30ml- 60ml. If baby is going to breast needs 18-925ml.  Assessed mom's breast noted a few small knots to Rt. outter breast. Mom stated she wants to breast milk and formula feed. Baby given Gerber formula, took 15 ml then started gagging and heaving. Holding baby upright pace feeding. Mom stated she would try to put baby to breast. Assisted in latching to Rt. Breast, mom has small short shaft nipple. Mom grimacing. Fitted for #16 NS. Doesn't fit well d/t shape of breast, nipple towards bottom end of breast. Mom stated cont. To hurt, flange lips. Baby had tight lips. Even w/bottle feeding lips tuck in and needs to be flanged out. Baby finally took 25ml Gerber in sitting upright position, pace feeding.  Mom pumped after BF, collected 12ml colostrum. Will save for next feeding. Reminded mom has to ask for another bottle of formula w/each feeding. If mom BF first then she would give the lower amount of supplement if could pump and hand expressed the amount needed, will not have to give formula. Mom stated that she was going to do both. Discussed LEAD. Mom had pacifier in baby's mouth. Discouraged for 2 weeks. Reported to RN. Patient Name: [name] WGNFA'OToday's Date: 10/11/2017 Reason for consult: Follow-up assessment;Difficult latch   Maternal Data    Feeding Feeding Type: Breast Milk with Formula added Nipple Type: Slow - flow Length of feed: 10 min  LATCH Score Latch: Repeated attempts needed to sustain latch, nipple held in mouth throughout feeding, stimulation needed to  elicit sucking reflex.  Audible Swallowing: Spontaneous and intermittent  Type of Nipple: Everted at rest and after stimulation  Comfort (Breast/Nipple): Filling, red/small blisters or bruises, mild/mod discomfort  Hold (Positioning): Full assist, staff holds infant at breast  LATCH Score: 6  Interventions Interventions: Breast feeding basics reviewed;Coconut oil;Assisted with latch;Breast compression;Shells;Skin to skin;Adjust position;Breast massage;Support pillows;Hand express;Position options;DEBP;Expressed milk  Lactation Tools Discussed/Used Tools: Shells;Pump;Flanges;Coconut oil;Nipple Dorris CarnesShields;Bottle Nipple shield size: 16 Flange Size: 21 Shell Type: Inverted Breast pump type: Double-Electric Breast Pump   Consult Status Consult Status: Follow-up Date: 10/11/17 Follow-up type: In-patient    Alexandria [name], Diamond NickelLAURA G 10/11/2017, 2:01 AM

## 2017-10-16 ENCOUNTER — Encounter: Payer: Self-pay | Admitting: Student

## 2017-11-19 ENCOUNTER — Ambulatory Visit: Payer: Self-pay

## 2017-11-19 ENCOUNTER — Telehealth: Payer: Self-pay

## 2017-11-19 NOTE — Telephone Encounter (Signed)
Patient missed her appointment for her postpartum. I called, and left a message that I had her rescheduled.

## 2017-11-20 ENCOUNTER — Ambulatory Visit: Payer: Self-pay | Admitting: Advanced Practice Midwife

## 2017-12-02 NOTE — L&D Delivery Note (Addendum)
Patient is 22 y.o. G2P1001 [redacted]w[redacted]d admeitted for SOL, hx of short interval pregnancy.   Delivery Note At 8:39 PM a viable female was delivered via SVD (Presentation: cephalic; ROA ).  APGAR: 8, 9; weight  .   Placenta status: Spontaneous, intact.  Cord: 3VC  Anesthesia:  Epidural Episiotomy: None Lacerations: None Suture Repair: none Est. Blood Loss (mL): 302  Mom to postpartum.  Baby to Couplet care / Skin to Skin.  Upon arrival patient was complete and pushing. She pushed with good maternal effort to deliver a healthy baby girl. Baby delivered without difficulty, nuchal cord x1 was observed and reduced after the delivery of the fetal head.  Baby was noted to have good tone and place on maternal abdomen for oral suctioning, drying and stimulation. Delayed cord clamping performed. Placenta delivered intact with 3V cord. Vaginal canal and perineum was inspected and found to be intact without laceration. Pitocin was started and uterus massaged until bleeding slowed. Counts of sharps, instruments, and lap pads were all correct.   Mirian Mo, MD PGY-1 9/25/20198:51 PM  I confirm that I have verified the information documented in the resident's note and that I have also personally reperformed the physical exam and all medical decision making activities.  I was gloved and present for entire delivery SVD without incident No difficulty with shoulders No lacerations  Aviva Signs, CNM  Please schedule this patient for Postpartum visit in: 4 weeks with the following provider: Any provider For C/S patients schedule nurse incision check in weeks 2 weeks: no Low risk pregnancy complicated by: none Delivery mode:  SVD Anticipated Birth Control:  LARC PP Procedures needed: none  Schedule Integrated BH visit: no

## 2017-12-29 ENCOUNTER — Other Ambulatory Visit: Payer: Self-pay

## 2017-12-29 ENCOUNTER — Encounter (HOSPITAL_COMMUNITY): Payer: Self-pay | Admitting: *Deleted

## 2017-12-29 ENCOUNTER — Emergency Department (HOSPITAL_COMMUNITY)
Admission: EM | Admit: 2017-12-29 | Discharge: 2017-12-29 | Disposition: A | Discharge status: Home / Self Care | Payer: Self-pay | Attending: Emergency Medicine | Admitting: Emergency Medicine

## 2017-12-29 DIAGNOSIS — Z3201 Encounter for pregnancy test, result positive: Secondary | ICD-10-CM | POA: Insufficient documentation

## 2017-12-29 DIAGNOSIS — Z79899 Other long term (current) drug therapy: Secondary | ICD-10-CM | POA: Insufficient documentation

## 2017-12-29 DIAGNOSIS — Z3A01 Less than 8 weeks gestation of pregnancy: Secondary | ICD-10-CM | POA: Insufficient documentation

## 2017-12-29 LAB — I-STAT BETA HCG BLOOD, ED (MC, WL, AP ONLY): HCG, QUANTITATIVE: 191.8 m[IU]/mL — AB (ref <5)

## 2017-12-29 LAB — I-STAT TROPONIN, ED: TROPONIN I, POC: 0 ng/mL (ref 0.00–0.08)

## 2017-12-29 NOTE — ED Provider Notes (Signed)
[facility] Provider Note  CSN: [id number] Arrival date & time: 12/29/17 1156  Chief Complaint(s) No chief complaint on file.  HPI Alexandria [name] Alexandria [name] is a 22 y.o. female who is a G2 P1 with last menstrual.  On December 16 who presents to the emergency department for confirmation of pregnancy.  She reports that she had 3+ pregnancy test at home.  She contacted women's outpatient clinic who instructed her to be seen by a physician to confirm the pregnancy.  She is asymptomatic and denies any abdominal pain, vaginal bleeding or discharge.  HPI  Past Medical History Past Medical History:  Diagnosis Date  . Medical history non-contributory    Patient Active Problem List   Diagnosis Date Noted  . Indication for care in labor or delivery 10/08/2017  . Indication for care in labor and delivery, antepartum 10/08/2017  . SVD (spontaneous vaginal delivery) 10/08/2017  . Encounter for supervision of normal first pregnancy in third trimester 08/27/2017   Home Medication(s) Prior to Admission medications   Medication Sig Start Date End Date Taking? Authorizing Provider  ibuprofen (ADVIL,MOTRIN) 600 MG tablet Take 1 tablet (600 mg total) every 6 (six) hours by mouth. 10/10/17   Minott, Lynnae PrudeKeriann S, MD  Prenatal Vit-Fe Fumarate-FA (PRENATAL MULTIVITAMIN) TABS tablet Take 1 tablet by mouth daily at 12 noon.    [provider]  senna-docusate (SENOKOT-S) 8.6-50 MG tablet Take 2 tablets daily by mouth. 10/11/17   Minott, Lynnae PrudeKeriann S, MD                                                                                                                                    Past Surgical History Past Surgical History:  Procedure Laterality Date  . NO PAST SURGERIES     Family History Family History  Problem Relation Age of Onset  . Diabetes Maternal Grandmother     Social History Social History   Tobacco Use  . Smoking status: Never Smoker  .  Smokeless tobacco: Never Used  Substance Use Topics  . Alcohol use: No  . Drug use: No   Allergies Patient has no known allergies.  Review of Systems Review of Systems  Physical Exam Vital Signs  I have reviewed the triage vital signs BP 127/79 (BP Location: Right Arm)   Pulse 88   Temp 97.8 F (36.6 C) (Oral)   Resp 18   LMP 12/17/2017 (Exact Date)   SpO2 100%   Breastfeeding? No   Physical Exam  Constitutional: She is oriented to person, place, and time. She appears well-developed and well-nourished. No distress.  HENT:  Head: Normocephalic and atraumatic.  Right Ear: External ear normal.  Left Ear: External ear normal.  Nose: Nose normal.  Eyes: Conjunctivae and EOM are normal. No scleral icterus.  Neck: Normal range of motion and phonation normal.  Cardiovascular: Normal rate and regular rhythm.  Pulmonary/Chest: Effort normal. No stridor.  No respiratory distress.  Abdominal: She exhibits no distension.  Musculoskeletal: Normal range of motion. She exhibits no edema.  Neurological: She is alert and oriented to person, place, and time.  Skin: She is not diaphoretic.  Psychiatric: She has a normal mood and affect. Her behavior is normal.  Vitals reviewed.   ED Results and Treatments Labs (all labs ordered are listed, but only abnormal results are displayed) Labs Reviewed  I-STAT BETA HCG BLOOD, ED ([facility], [facility], [facility] ONLY) - Abnormal; Notable for the following components:      Result Value   I-stat hCG, quantitative 191.8 (*)    All other components within normal limits  I-STAT TROPONIN, ED                                                                                                                         EKG  EKG Interpretation  Date/Time:    Ventricular Rate:    PR Interval:    QRS Duration:   QT Interval:    QTC Calculation:   R Axis:     Text Interpretation:        Radiology No results found. Pertinent labs & imaging results that were available  during my care of the patient were reviewed by me and considered in my medical decision making (see chart for details).  Medications Ordered in ED Medications - No data to display                                                                                                                                  Procedures Procedures  (including critical care time)  Medical Decision Making / ED Course I have reviewed the nursing notes for this encounter and the patient's prior records (if available in EHR or on provided paperwork).    Uncomplicated pregnancy confirmed by beta hCG.  Less than 8 weeks.  Follow-up with women's outpatient clinic.  The patient is safe for discharge with strict return precautions.   Final Clinical Impression(s) / ED Diagnoses Final diagnoses:  Less than [redacted] weeks gestation of pregnancy   Disposition: Discharge  Condition: Good  I have discussed the results, Dx and Tx plan with the patient who expressed understanding and agree(s) with the plan. Discharge instructions discussed at great length. The patient was given strict return precautions who verbalized understanding of the instructions. No further questions at time of discharge.  ED Discharge Orders    None       Follow Up: [facility] [address] [place] [address] [phone]        This chart was dictated using voice recognition software.  Despite best efforts to proofread,  errors can occur which can change the documentation meaning.   Nira Conn, MD 12/29/17 1432

## 2017-12-29 NOTE — ED Triage Notes (Signed)
Pt is here to get a pregnancy test.  LMP 12/16. Pt is having right mid abdominal pain that started 1 week ago.Pt had 3 positive pregnancy tests at home.  No vaginal bleeding or discharge

## 2017-12-29 NOTE — ED Notes (Signed)
Pt requesting results for preg test and denies any access to prenatal care. MD informed.

## 2018-02-09 ENCOUNTER — Encounter: Payer: Self-pay | Admitting: Advanced Practice Midwife

## 2018-03-19 ENCOUNTER — Ambulatory Visit (INDEPENDENT_AMBULATORY_CARE_PROVIDER_SITE_OTHER): Payer: Self-pay | Admitting: Student

## 2018-03-19 ENCOUNTER — Encounter: Payer: Self-pay | Admitting: Student

## 2018-03-19 VITALS — BP 95/53 | HR 79 | Wt 134.2 lb

## 2018-03-19 DIAGNOSIS — Z3492 Encounter for supervision of normal pregnancy, unspecified, second trimester: Secondary | ICD-10-CM

## 2018-03-19 DIAGNOSIS — Z3482 Encounter for supervision of other normal pregnancy, second trimester: Secondary | ICD-10-CM | POA: Insufficient documentation

## 2018-03-19 DIAGNOSIS — Z3400 Encounter for supervision of normal first pregnancy, unspecified trimester: Secondary | ICD-10-CM

## 2018-03-19 DIAGNOSIS — Z113 Encounter for screening for infections with a predominantly sexual mode of transmission: Secondary | ICD-10-CM

## 2018-03-19 DIAGNOSIS — O09892 Supervision of other high risk pregnancies, second trimester: Secondary | ICD-10-CM | POA: Insufficient documentation

## 2018-03-19 LAB — POCT URINALYSIS DIP (DEVICE)
Glucose, UA: NEGATIVE mg/dL
Hgb urine dipstick: NEGATIVE
KETONES UR: 15 mg/dL — AB
LEUKOCYTES UA: NEGATIVE
NITRITE: NEGATIVE
PH: 6 (ref 5.0–8.0)
Protein, ur: 30 mg/dL — AB
Specific Gravity, Urine: 1.025 (ref 1.005–1.030)
Urobilinogen, UA: 2 mg/dL — ABNORMAL HIGH (ref 0.0–1.0)

## 2018-03-19 NOTE — Progress Notes (Signed)
[facility] 04/23/18 1:00.

## 2018-03-19 NOTE — Progress Notes (Signed)
  Subjective:    Alexandria [name] is being seen today for her first obstetrical visit.  This is not a planned pregnancy. She is at 7450w1d gestation. Her obstetrical history is significant for short interval between pregnancies. Relationship with FOB: significant other, living together. Patient does intend to breast feed. Pregnancy history fully reviewed.  Patient reports no complaints.  Review of Systems:   Review of Systems  Constitutional: Negative.   HENT: Negative.   Respiratory: Negative.   Cardiovascular: Negative.   Gastrointestinal: Negative.   Genitourinary: Negative.   Musculoskeletal: Negative.   Neurological: Negative.   Hematological: Negative.     Objective:     BP (!) 95/53   Pulse 79   Wt 134 lb 3.2 oz (60.9 kg)   LMP 12/17/2017 (Exact Date)   BMI 24.55 kg/m  Physical Exam  Constitutional: She is oriented to person, place, and time. She appears well-developed.  HENT:  Head: Normocephalic.  Eyes: Pupils are equal, round, and reactive to light.  Neck: Normal range of motion.  GI: Soft.  Musculoskeletal: Normal range of motion.  Neurological: She is alert and oriented to person, place, and time.  Skin: Skin is warm and dry.    Exam    Assessment:    Pregnancy: G2P1001 Patient Active Problem List   Diagnosis Date Noted  . Supervision of normal first pregnancy, antepartum 03/19/2018       Plan:     Initial labs drawn. Prenatal vitamins. Problem list reviewed and updated. AFP3 discussed: WIll do NIPS as soon as we can confirm dating. . Role of ultrasound in pregnancy discussed; fetal survey: [facility] as patient is a self-pay patient. . Amniocentesis discussed: not indicated. Follow up in 4 weeks. 50% of 30 min visit spent on counseling and coordination of care.  -NIPS not done today as patient is very unsure about her LMP; she does not remember telling ED provider in January that her LMP was Jan 13. Fundus is below the  umbilicus.  FH appears to be around 13 weeks, which corresponds to patient's stated LMP from her chart in January. Will reassess based on US in 4 weeks.  Charlesetta GaribaldiKathryn Lorraine [facility] 03/19/2018

## 2018-03-19 NOTE — Patient Instructions (Addendum)
First Trimester of Pregnancy The first trimester of pregnancy is from week 1 until the end of week 13 (months 1 through 3). During this time, your baby will begin to develop inside you. At 6-8 weeks, the eyes and face are formed, and the heartbeat can be seen on ultrasound. At the end of 12 weeks, all the baby's organs are formed. Prenatal care is all the medical care you receive before the birth of your baby. Make sure you get good prenatal care and follow all of your doctor's instructions. Follow these instructions at home: Medicines  Take over-the-counter and prescription medicines only as told by your doctor. Some medicines are safe and some medicines are not safe during pregnancy.  Take a prenatal vitamin that contains at least 600 micrograms (mcg) of folic acid.  If you have trouble pooping (constipation), take medicine that will make your stool soft (stool softener) if your doctor approves. Eating and drinking  Eat regular, healthy meals.  Your doctor will tell you the amount of weight gain that is right for you.  Avoid raw meat and uncooked cheese.  If you feel sick to your stomach (nauseous) or throw up (vomit): ? Eat 4 or 5 small meals a day instead of 3 large meals. ? Try eating a few soda crackers. ? Drink liquids between meals instead of during meals.  To prevent constipation: ? Eat foods that are high in fiber, like fresh fruits and vegetables, whole grains, and beans. ? Drink enough fluids to keep your pee (urine) clear or pale yellow. Activity  Exercise only as told by your doctor. Stop exercising if you have cramps or pain in your lower belly (abdomen) or low back.  Do not exercise if it is too hot, too humid, or if you are in a place of great height (high altitude).  Try to avoid standing for long periods of time. Move your legs often if you must stand in one place for a long time.  Avoid heavy lifting.  Wear low-heeled shoes. Sit and stand up straight.  You  can have sex unless your doctor tells you not to. Relieving pain and discomfort  Wear a good support bra if your breasts are sore.  Take warm water baths (sitz baths) to soothe pain or discomfort caused by hemorrhoids. Use hemorrhoid cream if your doctor says it is okay.  Rest with your legs raised if you have leg cramps or low back pain.  If you have puffy, bulging veins (varicose veins) in your legs: ? Wear support hose or compression stockings as told by your doctor. ? Raise (elevate) your feet for 15 minutes, 3-4 times a day. ? Limit salt in your food. Prenatal care  Schedule your prenatal visits by the twelfth week of pregnancy.  Write down your questions. Take them to your prenatal visits.  Keep all your prenatal visits as told by your doctor. This is important. Safety  Wear your seat belt at all times when driving.  Make a list of emergency phone numbers. The list should include numbers for family, friends, the hospital, and police and fire departments. General instructions  Ask your doctor for a referral to a local prenatal class. Begin classes no later than at the start of month 6 of your pregnancy.  Ask for help if you need counseling or if you need help with nutrition. Your doctor can give you advice or tell you where to go for help.  Do not use hot tubs, steam rooms, or   saunas.  Do not douche or use tampons or scented sanitary pads.  Do not cross your legs for long periods of time.  Avoid all herbs and alcohol. Avoid drugs that are not approved by your doctor.  Do not use any tobacco products, including cigarettes, chewing tobacco, and electronic cigarettes. If you need help quitting, ask your doctor. You may get counseling or other support to help you quit.  Avoid cat litter boxes and soil used by cats. These carry germs that can cause birth defects in the baby and can cause a loss of your baby (miscarriage) or stillbirth.  Visit your dentist. At home, brush  your teeth with a soft toothbrush. Be gentle when you floss. Contact a doctor if:  You are dizzy.  You have mild cramps or pressure in your lower belly.  You have a nagging pain in your belly area.  You continue to feel sick to your stomach, you throw up, or you have watery poop (diarrhea).  You have a bad smelling fluid coming from your vagina.  You have pain when you pee (urinate).  You have increased puffiness (swelling) in your face, hands, legs, or ankles. Get help right away if:  You have a fever.  You are leaking fluid from your vagina.  You have spotting or bleeding from your vagina.  You have very bad belly cramping or pain.  You gain or lose weight rapidly.  You throw up blood. It may look like coffee grounds.  You are around people who have [place] measles, fifth disease, or chickenpox.  You have a very bad headache.  You have shortness of breath.  You have any kind of trauma, such as from a fall or a car accident. Summary  The first trimester of pregnancy is from week 1 until the end of week 13 (months 1 through 3).  To take care of yourself and your unborn baby, you will need to eat healthy meals, take medicines only if your doctor tells you to do so, and do activities that are safe for you and your baby.  Keep all follow-up visits as told by your doctor. This is important as your doctor will have to ensure that your baby is healthy and growing well. This information is not intended to replace advice given to you by your health care provider. Make sure you discuss any questions you have with your health care provider. Document Released: 05/06/2008 Document Revised: 11/26/2016 Document Reviewed: 11/26/2016 Elsevier Interactive Patient Education  2017 [employer]vier Inc.    [facility] at 1:00.

## 2018-03-19 NOTE — Progress Notes (Signed)
Given pt new ob package. Pt has Mychart and signed up for babyscript app.

## 2018-03-20 LAB — GC/CHLAMYDIA PROBE AMP (~~LOC~~) NOT AT ARMC
Chlamydia: NEGATIVE
NEISSERIA GONORRHEA: NEGATIVE

## 2018-03-21 LAB — CULTURE, OB URINE

## 2018-03-21 LAB — URINE CULTURE, OB REFLEX: ORGANISM ID, BACTERIA: NO GROWTH

## 2018-03-23 ENCOUNTER — Encounter: Payer: Self-pay | Admitting: *Deleted

## 2018-03-27 LAB — SMN1 COPY NUMBER ANALYSIS (SMA CARRIER SCREENING)

## 2018-03-27 LAB — OBSTETRIC PANEL, INCLUDING HIV
ANTIBODY SCREEN: NEGATIVE
BASOS: 0 %
Basophils Absolute: 0 10*3/uL (ref 0.0–0.2)
EOS (ABSOLUTE): 0.1 10*3/uL (ref 0.0–0.4)
EOS: 1 %
HEMATOCRIT: 34.6 % (ref 34.0–46.6)
HEMOGLOBIN: 12 g/dL (ref 11.1–15.9)
HIV Screen 4th Generation wRfx: NONREACTIVE
Hepatitis B Surface Ag: NEGATIVE
Immature Grans (Abs): 0 10*3/uL (ref 0.0–0.1)
Immature Granulocytes: 0 %
LYMPHS ABS: 2 10*3/uL (ref 0.7–3.1)
Lymphs: 23 %
MCH: 29.4 pg (ref 26.6–33.0)
MCHC: 34.7 g/dL (ref 31.5–35.7)
MCV: 85 fL (ref 79–97)
MONOS ABS: 0.6 10*3/uL (ref 0.1–0.9)
Monocytes: 8 %
Neutrophils Absolute: 5.8 10*3/uL (ref 1.4–7.0)
Neutrophils: 68 %
Platelets: 209 10*3/uL (ref 150–379)
RBC: 4.08 x10E6/uL (ref 3.77–5.28)
RDW: 15 % (ref 12.3–15.4)
RH TYPE: POSITIVE
RPR Ser Ql: NONREACTIVE
Rubella Antibodies, IGG: 1.71 index (ref >0.99)
WBC: 8.5 10*3/uL (ref 3.4–10.8)

## 2018-04-17 ENCOUNTER — Ambulatory Visit (INDEPENDENT_AMBULATORY_CARE_PROVIDER_SITE_OTHER): Payer: Self-pay | Admitting: Family Medicine

## 2018-04-17 VITALS — BP 102/57 | HR 77 | Wt 137.6 lb

## 2018-04-17 DIAGNOSIS — Z3400 Encounter for supervision of normal first pregnancy, unspecified trimester: Secondary | ICD-10-CM

## 2018-04-17 DIAGNOSIS — Z3402 Encounter for supervision of normal first pregnancy, second trimester: Secondary | ICD-10-CM

## 2018-04-17 DIAGNOSIS — O09892 Supervision of other high risk pregnancies, second trimester: Secondary | ICD-10-CM

## 2018-04-17 NOTE — Progress Notes (Signed)
   PRENATAL VISIT NOTE  Subjective:  Alexandria [name] is a 22 y.o. G2P1001 at [redacted]w[redacted]d being seen today for ongoing prenatal care.  She is currently monitored for the following issues for this low-risk pregnancy and has Supervision of normal first pregnancy, antepartum; Currently pregnant in second trimester with unknown gestational age; and Short interval between pregnancies affecting pregnancy in second trimester, antepartum on their problem list.  Patient reports no complaints.   . Vag. Bleeding: None.  Movement: Present. Denies leaking of fluid.   The following portions of the patient's history were reviewed and updated as appropriate: allergies, current medications, past family history, past medical history, past social history, past surgical history and problem list. Problem list updated.  Objective:   Vitals:   04/17/18 0820  BP: (!) 102/57  Pulse: 77  Weight: 137 lb 9.6 oz (62.4 kg)    Fetal Status: Fetal Heart Rate (bpm): 152   Movement: Present     General:  Alert, oriented and cooperative. Patient is in no acute distress.  Skin: Skin is warm and dry. No rash noted.   Cardiovascular: Normal heart rate noted  Respiratory: Normal respiratory effort, no problems with respiration noted  Abdomen: Soft, gravid, appropriate for gestational age.  Pain/Pressure: Present     Pelvic: Cervical exam deferred        Extremities: Normal range of motion.  Edema: None  Mental Status: Normal mood and affect. Normal behavior. Normal judgment and thought content.   Assessment and Plan:  Pregnancy: G2P1001 at [redacted]w[redacted]d  1. Encounter for supervision of normal first pregnancy in second trimester Doing well.  Dates based on uncertain LMP. Has U/S scheduled at [facility] on [facility] 23rd.  FH ~16-18 weeks (c/w LMP)  2. Supervision of normal first pregnancy, antepartum  3. Short interval between pregnancies affecting pregnancy in second trimester, antepartum   Preterm labor symptoms and general  obstetric precautions including but not limited to vaginal bleeding, contractions, leaking of fluid and fetal movement were reviewed in detail with the patient. Please refer to After Visit Summary for other counseling recommendations.  Return in about 1 month (around 05/15/2018) for LOB.  Future Appointments  Date Time Provider Department Center  05/14/2018  8:55 AM Judeth Horn, NP [facility]    Frederik Pear, MD

## 2018-04-17 NOTE — Patient Instructions (Addendum)
Ultrasound on 04/23/2018 at 1 pm at [facility] Department: [address] [place] [address] Phone: [phone]   Second Trimester of Pregnancy The second trimester is from week 13 through week 28, month 4 through 6. This is often the time in pregnancy that you feel your best. Often times, morning sickness has lessened or quit. You may have more energy, and you may get hungry more often. Your unborn baby (fetus) is growing rapidly. At the end of the sixth month, he or she is about 9 inches long and weighs about 1 pounds. You will likely feel the baby move (quickening) between 18 and 20 weeks of pregnancy. Follow these instructions at home:  Avoid all smoking, herbs, and alcohol. Avoid drugs not approved by your doctor.  Do not use any tobacco products, including cigarettes, chewing tobacco, and electronic cigarettes. If you need help quitting, ask your doctor. You may get counseling or other support to help you quit.  Only take medicine as told by your doctor. Some medicines are safe and some are not during pregnancy.  Exercise only as told by your doctor. Stop exercising if you start having cramps.  Eat regular, healthy meals.  Wear a good support bra if your breasts are tender.  Do not use hot tubs, steam rooms, or saunas.  Wear your seat belt when driving.  Avoid raw meat, uncooked cheese, and liter boxes and soil used by cats.  Take your prenatal vitamins.  Take 1500-2000 milligrams of calcium daily starting at the 20th week of pregnancy until you deliver your baby.  Try taking medicine that helps you poop (stool softener) as needed, and if your doctor approves. Eat more fiber by eating fresh fruit, vegetables, and whole grains. Drink enough fluids to keep your pee (urine) clear or pale yellow.  Take warm water baths (sitz baths) to soothe pain or discomfort caused by hemorrhoids. Use hemorrhoid cream if your doctor approves.  If you have puffy, bulging  veins (varicose veins), wear support hose. Raise (elevate) your feet for 15 minutes, 3-4 times a day. Limit salt in your diet.  Avoid heavy lifting, wear low heals, and sit up straight.  Rest with your legs raised if you have leg cramps or low back pain.  Visit your dentist if you have not gone during your pregnancy. Use a soft toothbrush to brush your teeth. Be gentle when you floss.  You can have sex (intercourse) unless your doctor tells you not to.  Go to your doctor visits. Get help if:  You feel dizzy.  You have mild cramps or pressure in your lower belly (abdomen).  You have a nagging pain in your belly area.  You continue to feel sick to your stomach (nauseous), throw up (vomit), or have watery poop (diarrhea).  You have bad smelling fluid coming from your vagina.  You have pain with peeing (urination). Get help right away if:  You have a fever.  You are leaking fluid from your vagina.  You have spotting or bleeding from your vagina.  You have severe belly cramping or pain.  You lose or gain weight rapidly.  You have trouble catching your breath and have chest pain.  You notice sudden or extreme puffiness (swelling) of your face, hands, ankles, feet, or legs.  You have not felt the baby move in over an hour.  You have severe headaches that do not go away with medicine.  You have vision changes. This information is not intended to  replace advice given to you by your health care provider. Make sure you discuss any questions you have with your health care provider. Document Released: 02/12/2010 Document Revised: 04/25/2016 Document Reviewed: 01/19/2013 Elsevier Interactive Patient Education  2017 [employer].

## 2018-05-14 ENCOUNTER — Encounter: Payer: Self-pay | Admitting: Student

## 2018-06-15 ENCOUNTER — Encounter: Payer: Self-pay | Admitting: Advanced Practice Midwife

## 2018-06-24 ENCOUNTER — Ambulatory Visit: Payer: Self-pay | Admitting: Student

## 2018-06-24 VITALS — BP 105/62 | HR 82 | Wt 148.0 lb

## 2018-06-24 DIAGNOSIS — Z363 Encounter for antenatal screening for malformations: Secondary | ICD-10-CM

## 2018-06-24 DIAGNOSIS — Z3400 Encounter for supervision of normal first pregnancy, unspecified trimester: Secondary | ICD-10-CM

## 2018-06-24 DIAGNOSIS — Z3A27 27 weeks gestation of pregnancy: Secondary | ICD-10-CM

## 2018-06-24 NOTE — Progress Notes (Signed)
Patient checked in to room 5. When I went to room, patient wasn't there. Patient checked out & told registration staff that she had already been seen.  Patient needs to schedule 28 wks labs & ultrasound. Had anatomy ultrasound scheduled at [place] but did not keep that appt.

## 2018-06-24 NOTE — Patient Instructions (Signed)
Research childbirth classes and hospital preregistration at [email or web address]  Fetal Movement Counts Patient Name: ________________________________________________ Patient Due Date: ____________________ What is a fetal movement count? A fetal movement count is the number of times that you feel your baby move during a certain amount of time. This may also be called a fetal kick count. A fetal movement count is recommended for every pregnant woman. You may be asked to start counting fetal movements as early as week 28 of your pregnancy. Pay attention to when your baby is most active. You may notice your baby's sleep and wake cycles. You may also notice things that make your baby move more. You should do a fetal movement count:  When your baby is normally most active.  At the same time each day.  A good time to count movements is while you are resting, after having something to eat and drink. How do I count fetal movements? 1. Find a quiet, comfortable area. Sit, or lie down on your side. 2. Write down the date, the start time and stop time, and the number of movements that you felt between those two times. Take this information with you to your health care visits. 3. For 2 hours, count kicks, flutters, swishes, rolls, and jabs. You should feel at least 10 movements during 2 hours. 4. You may stop counting after you have felt 10 movements. 5. If you do not feel 10 movements in 2 hours, have something to eat and drink. Then, keep resting and counting for 1 hour. If you feel at least 4 movements during that hour, you may stop counting. Contact a health care provider if:  You feel fewer than 4 movements in 2 hours.  Your baby is not moving like he or she usually does. Date: ____________ Start time: ____________ Stop time: ____________ Movements: ____________ Date: ____________ Start time: ____________ Stop time: ____________ Movements: ____________ Date: ____________ Start time: ____________  Stop time: ____________ Movements: ____________ Date: ____________ Start time: ____________ Stop time: ____________ Movements: ____________ Date: ____________ Start time: ____________ Stop time: ____________ Movements: ____________ Date: ____________ Start time: ____________ Stop time: ____________ Movements: ____________ Date: ____________ Start time: ____________ Stop time: ____________ Movements: ____________ Date: ____________ Start time: ____________ Stop time: ____________ Movements: ____________ Date: ____________ Start time: ____________ Stop time: ____________ Movements: ____________ This information is not intended to replace advice given to you by your health care provider. Make sure you discuss any questions you have with your health care provider. Document Released: 12/18/2006 Document Revised: 07/17/2016 Document Reviewed: 12/28/2015 Elsevier Interactive Patient Education  2018 Elsevier Inc.  Braxton Hicks Contractions Contractions of the uterus can occur throughout pregnancy, but they are not always a sign that you are in labor. You may have practice contractions called Braxton Hicks contractions. These false labor contractions are sometimes confused with true labor. What are Braxton Hicks contractions? Braxton Hicks contractions are tightening movements that occur in the muscles of the uterus before labor. Unlike true labor contractions, these contractions do not result in opening (dilation) and thinning of the cervix. Toward the end of pregnancy (32-34 weeks), Braxton Hicks contractions can happen more often and may become stronger. These contractions are sometimes difficult to tell apart from true labor because they can be very uncomfortable. You should not feel embarrassed if you go to the hospital with false labor. Sometimes, the only way to tell if you are in true labor is for your health care provider to look for changes in the cervix. The health care provider will   do a physical  exam and may monitor your contractions. If you are not in true labor, the exam should show that your cervix is not dilating and your water has not broken. If there are other health problems associated with your pregnancy, it is completely safe for you to be sent home with false labor. You may continue to have Braxton Hicks contractions until you go into true labor. How to tell the difference between true labor and false labor True labor  Contractions last 30-70 seconds.  Contractions become very regular.  Discomfort is usually felt in the top of the uterus, and it spreads to the lower abdomen and low back.  Contractions do not go away with walking.  Contractions usually become more intense and increase in frequency.  The cervix dilates and gets thinner. False labor  Contractions are usually shorter and not as strong as true labor contractions.  Contractions are usually irregular.  Contractions are often felt in the front of the lower abdomen and in the groin.  Contractions may go away when you walk around or change positions while lying down.  Contractions get weaker and are shorter-lasting as time goes on.  The cervix usually does not dilate or become thin. Follow these instructions at home:  Take over-the-counter and prescription medicines only as told by your health care provider.  Keep up with your usual exercises and follow other instructions from your health care provider.  Eat and drink lightly if you think you are going into labor.  If Braxton Hicks contractions are making you uncomfortable: ? Change your position from lying down or resting to walking, or change from walking to resting. ? Sit and rest in a tub of warm water. ? Drink enough fluid to keep your urine pale yellow. Dehydration may cause these contractions. ? Do slow and deep breathing several times an hour.  Keep all follow-up prenatal visits as told by your health care provider. This is  important. Contact a health care provider if:  You have a fever.  You have continuous pain in your abdomen. Get help right away if:  Your contractions become stronger, more regular, and closer together.  You have fluid leaking or gushing from your vagina.  You pass blood-tinged mucus (bloody show).  You have bleeding from your vagina.  You have low back pain that you never had before.  You feel your baby's head pushing down and causing pelvic pressure.  Your baby is not moving inside you as much as it used to. Summary  Contractions that occur before labor are called Braxton Hicks contractions, false labor, or practice contractions.  Braxton Hicks contractions are usually shorter, weaker, farther apart, and less regular than true labor contractions. True labor contractions usually become progressively stronger and regular and they become more frequent.  Manage discomfort from Braxton Hicks contractions by changing position, resting in a warm bath, drinking plenty of water, or practicing deep breathing. This information is not intended to replace advice given to you by your health care provider. Make sure you discuss any questions you have with your health care provider. Document Released: 04/03/2017 Document Revised: 04/03/2017 Document Reviewed: 04/03/2017 Elsevier Interactive Patient Education  2018 Elsevier Inc.    

## 2018-07-01 ENCOUNTER — Other Ambulatory Visit: Payer: Self-pay | Admitting: *Deleted

## 2018-07-01 ENCOUNTER — Other Ambulatory Visit: Payer: Self-pay

## 2018-07-01 DIAGNOSIS — Z3400 Encounter for supervision of normal first pregnancy, unspecified trimester: Secondary | ICD-10-CM

## 2018-07-08 ENCOUNTER — Ambulatory Visit (INDEPENDENT_AMBULATORY_CARE_PROVIDER_SITE_OTHER): Payer: Self-pay | Admitting: Obstetrics and Gynecology

## 2018-07-08 VITALS — BP 105/62 | HR 80 | Wt 150.3 lb

## 2018-07-08 DIAGNOSIS — Z23 Encounter for immunization: Secondary | ICD-10-CM

## 2018-07-08 DIAGNOSIS — Z3400 Encounter for supervision of normal first pregnancy, unspecified trimester: Secondary | ICD-10-CM

## 2018-07-08 DIAGNOSIS — Z3402 Encounter for supervision of normal first pregnancy, second trimester: Secondary | ICD-10-CM

## 2018-07-08 NOTE — Progress Notes (Signed)
C/o uc's everyday about 4 times per day.

## 2018-07-09 NOTE — Progress Notes (Signed)
Prenatal Visit Note Date: 07/08/2018 Clinic: [facility]  Subjective:  Alexandria [name] Alexandria [name] is a 22 y.o. G2P1001 at 129w0d being seen today for ongoing prenatal care.  She is currently monitored for the following issues for this low-risk pregnancy and has Supervision of normal first pregnancy, antepartum; Currently pregnant in second trimester with unknown gestational age; and Short interval between pregnancies affecting pregnancy in second trimester, antepartum on their problem list.  Patient reports no complaints.   Contractions: Irregular. Vag. Bleeding: None.  Movement: Present. Denies leaking of fluid.   The following portions of the patient's history were reviewed and updated as appropriate: allergies, current medications, past family history, past medical history, past social history, past surgical history and problem list. Problem list updated.  Objective:   Vitals:   07/08/18 1633  BP: 105/62  Pulse: 80  Weight: 150 lb 4.8 oz (68.2 kg)    Fetal Status: Fetal Heart Rate (bpm): 160 Fundal Height: 30 cm Movement: Present     General:  Alert, oriented and cooperative. Patient is in no acute distress.  Skin: Skin is warm and dry. No rash noted.   Cardiovascular: Normal heart rate noted  Respiratory: Normal respiratory effort, no problems with respiration noted  Abdomen: Soft, gravid, appropriate for gestational age. Pain/Pressure: Present     Pelvic:  Cervical exam deferred        Extremities: Normal range of motion.  Edema: None  Mental Status: Normal mood and affect. Normal behavior. Normal judgment and thought content.   Urinalysis:      Assessment and Plan:  Pregnancy: G2P1001 at 6855w1d  1. Supervision of normal first pregnancy, antepartum Missed last few appts. Pt told to make 28wk lab only visit asap. D/w pt re: BC nv. Pt unsure. Brochure given and seems interested in LARC methods - Tdap vaccine greater than or equal to 7yo IM  Preterm  labor symptoms and general obstetric precautions including but not limited to vaginal bleeding, contractions, leaking of fluid and fetal movement were reviewed in detail with the patient. Please refer to After Visit Summary for other counseling recommendations.  Return in about 2 weeks (around 07/22/2018) for rob.   [place], Uriah Trueba, MD

## 2018-07-10 ENCOUNTER — Encounter (HOSPITAL_COMMUNITY): Payer: Self-pay

## 2018-07-10 ENCOUNTER — Other Ambulatory Visit: Payer: Self-pay

## 2018-07-10 DIAGNOSIS — Z3400 Encounter for supervision of normal first pregnancy, unspecified trimester: Secondary | ICD-10-CM

## 2018-07-11 LAB — GLUCOSE TOLERANCE, 2 HOURS W/ 1HR
GLUCOSE, 2 HOUR: 143 mg/dL (ref 65–152)
Glucose, 1 hour: 175 mg/dL (ref 65–179)
Glucose, Fasting: 80 mg/dL (ref 65–91)

## 2018-07-11 LAB — CBC
HEMATOCRIT: 31.4 % — AB (ref 34.0–46.6)
HEMOGLOBIN: 10.9 g/dL — AB (ref 11.1–15.9)
MCH: 30 pg (ref 26.6–33.0)
MCHC: 34.7 g/dL (ref 31.5–35.7)
MCV: 87 fL (ref 79–97)
Platelets: 199 10*3/uL (ref 150–450)
RBC: 3.63 x10E6/uL — AB (ref 3.77–5.28)
RDW: 13.5 % (ref 12.3–15.4)
WBC: 8 10*3/uL (ref 3.4–10.8)

## 2018-07-11 LAB — HIV ANTIBODY (ROUTINE TESTING W REFLEX): HIV Screen 4th Generation wRfx: NONREACTIVE

## 2018-07-11 LAB — RPR: RPR: NONREACTIVE

## 2018-07-17 ENCOUNTER — Other Ambulatory Visit (HOSPITAL_COMMUNITY): Payer: Self-pay | Admitting: *Deleted

## 2018-07-17 ENCOUNTER — Ambulatory Visit (HOSPITAL_COMMUNITY)
Admission: RE | Admit: 2018-07-17 | Discharge: 2018-07-17 | Disposition: A | Discharge status: Home / Self Care | Payer: Self-pay | Source: Ambulatory Visit | Attending: Student | Admitting: Student

## 2018-07-17 DIAGNOSIS — Z362 Encounter for other antenatal screening follow-up: Secondary | ICD-10-CM

## 2018-07-17 DIAGNOSIS — O09893 Supervision of other high risk pregnancies, third trimester: Secondary | ICD-10-CM | POA: Insufficient documentation

## 2018-07-17 DIAGNOSIS — Z363 Encounter for antenatal screening for malformations: Secondary | ICD-10-CM | POA: Insufficient documentation

## 2018-07-17 DIAGNOSIS — O09293 Supervision of pregnancy with other poor reproductive or obstetric history, third trimester: Secondary | ICD-10-CM

## 2018-07-17 DIAGNOSIS — Z3400 Encounter for supervision of normal first pregnancy, unspecified trimester: Secondary | ICD-10-CM

## 2018-07-17 DIAGNOSIS — Z3A27 27 weeks gestation of pregnancy: Secondary | ICD-10-CM

## 2018-07-17 DIAGNOSIS — O0933 Supervision of pregnancy with insufficient antenatal care, third trimester: Secondary | ICD-10-CM | POA: Insufficient documentation

## 2018-07-17 DIAGNOSIS — Z3A32 32 weeks gestation of pregnancy: Secondary | ICD-10-CM | POA: Insufficient documentation

## 2018-07-22 ENCOUNTER — Encounter: Payer: Self-pay | Admitting: Internal Medicine

## 2018-08-14 ENCOUNTER — Encounter (HOSPITAL_COMMUNITY): Payer: Self-pay

## 2018-08-14 ENCOUNTER — Ambulatory Visit (HOSPITAL_COMMUNITY)
Admission: RE | Admit: 2018-08-14 | Discharge: 2018-08-14 | Disposition: A | Discharge status: Home / Self Care | Payer: Self-pay | Source: Ambulatory Visit | Attending: Student | Admitting: Student

## 2018-08-14 DIAGNOSIS — Z362 Encounter for other antenatal screening follow-up: Secondary | ICD-10-CM

## 2018-08-14 DIAGNOSIS — Z3A36 36 weeks gestation of pregnancy: Secondary | ICD-10-CM | POA: Insufficient documentation

## 2018-08-14 DIAGNOSIS — Z3689 Encounter for other specified antenatal screening: Secondary | ICD-10-CM | POA: Insufficient documentation

## 2018-08-14 DIAGNOSIS — O09893 Supervision of other high risk pregnancies, third trimester: Secondary | ICD-10-CM | POA: Insufficient documentation

## 2018-08-14 DIAGNOSIS — O0933 Supervision of pregnancy with insufficient antenatal care, third trimester: Secondary | ICD-10-CM | POA: Insufficient documentation

## 2018-08-23 ENCOUNTER — Other Ambulatory Visit: Payer: Self-pay

## 2018-08-23 ENCOUNTER — Inpatient Hospital Stay (HOSPITAL_COMMUNITY)
Admission: AD | Admit: 2018-08-23 | Discharge: 2018-08-23 | Disposition: A | Discharge status: Home / Self Care | Payer: Self-pay | Source: Ambulatory Visit | Attending: Obstetrics and Gynecology | Admitting: Obstetrics and Gynecology

## 2018-08-23 ENCOUNTER — Encounter (HOSPITAL_COMMUNITY): Payer: Self-pay

## 2018-08-23 DIAGNOSIS — O471 False labor at or after 37 completed weeks of gestation: Secondary | ICD-10-CM

## 2018-08-23 DIAGNOSIS — Z3400 Encounter for supervision of normal first pregnancy, unspecified trimester: Secondary | ICD-10-CM

## 2018-08-23 DIAGNOSIS — Z3A38 38 weeks gestation of pregnancy: Secondary | ICD-10-CM | POA: Insufficient documentation

## 2018-08-23 DIAGNOSIS — O26893 Other specified pregnancy related conditions, third trimester: Secondary | ICD-10-CM | POA: Insufficient documentation

## 2018-08-23 NOTE — Discharge Instructions (Signed)
Braxton Hicks Contractions °Contractions of the uterus can occur throughout pregnancy, but they are not always a sign that you are in labor. You may have practice contractions called Braxton Hicks contractions. These false labor contractions are sometimes confused with true labor. °What are Braxton Hicks contractions? °Braxton Hicks contractions are tightening movements that occur in the muscles of the uterus before labor. Unlike true labor contractions, these contractions do not result in opening (dilation) and thinning of the cervix. Toward the end of pregnancy (32-34 weeks), Braxton Hicks contractions can happen more often and may become stronger. These contractions are sometimes difficult to tell apart from true labor because they can be very uncomfortable. You should not feel embarrassed if you go to the hospital with false labor. °Sometimes, the only way to tell if you are in true labor is for your health care provider to look for changes in the cervix. The health care provider will do a physical exam and may monitor your contractions. If you are not in true labor, the exam should show that your cervix is not dilating and your water has not broken. °If there are other health problems associated with your pregnancy, it is completely safe for you to be sent home with false labor. You may continue to have Braxton Hicks contractions until you go into true labor. °How to tell the difference between true labor and false labor °True labor °· Contractions last 30-70 seconds. °· Contractions become very regular. °· Discomfort is usually felt in the top of the uterus, and it spreads to the lower abdomen and low back. °· Contractions do not go away with walking. °· Contractions usually become more intense and increase in frequency. °· The cervix dilates and gets thinner. °False labor °· Contractions are usually shorter and not as strong as true labor contractions. °· Contractions are usually irregular. °· Contractions  are often felt in the front of the lower abdomen and in the groin. °· Contractions may go away when you walk around or change positions while lying down. °· Contractions get weaker and are shorter-lasting as time goes on. °· The cervix usually does not dilate or become thin. °Follow these instructions at home: °· Take over-the-counter and prescription medicines only as told by your health care provider. °· Keep up with your usual exercises and follow other instructions from your health care provider. °· Eat and drink lightly if you think you are going into labor. °· If Braxton Hicks contractions are making you uncomfortable: °? Change your position from lying down or resting to walking, or change from walking to resting. °? Sit and rest in a tub of warm water. °? Drink enough fluid to keep your urine pale yellow. Dehydration may cause these contractions. °? Do slow and deep breathing several times an hour. °· Keep all follow-up prenatal visits as told by your health care provider. This is important. °Contact a health care provider if: °· You have a fever. °· You have continuous pain in your abdomen. °Get help right away if: °· Your contractions become stronger, more regular, and closer together. °· You have fluid leaking or gushing from your vagina. °· You pass blood-tinged mucus (bloody show). °· You have bleeding from your vagina. °· You have low back pain that you never had before. °· You feel your baby’s head pushing down and causing pelvic pressure. °· Your baby is not moving inside you as much as it used to. °Summary °· Contractions that occur before labor are called Braxton   Hicks contractions, false labor, or practice contractions. °· Braxton Hicks contractions are usually shorter, weaker, farther apart, and less regular than true labor contractions. True labor contractions usually become progressively stronger and regular and they become more frequent. °· Manage discomfort from Braxton Hicks contractions by  changing position, resting in a warm bath, drinking plenty of water, or practicing deep breathing. °This information is not intended to replace advice given to you by your health care provider. Make sure you discuss any questions you have with your health care provider. °Document Released: 04/03/2017 Document Revised: 04/03/2017 Document Reviewed: 04/03/2017 °Elsevier Interactive Patient Education © 2018 Elsevier Inc. ° °

## 2018-08-23 NOTE — MAU Note (Signed)
Pt reports uc's since 7 pm. + FM. Denies bleeding.

## 2018-08-26 ENCOUNTER — Inpatient Hospital Stay (HOSPITAL_COMMUNITY)
Admission: AD | Admit: 2018-08-26 | Discharge: 2018-08-28 | DRG: 807 | Disposition: A | Discharge status: Home / Self Care | Payer: Medicaid Other | Attending: Obstetrics and Gynecology | Admitting: Obstetrics and Gynecology

## 2018-08-26 ENCOUNTER — Encounter (HOSPITAL_COMMUNITY): Payer: Self-pay

## 2018-08-26 ENCOUNTER — Inpatient Hospital Stay (HOSPITAL_COMMUNITY): Payer: Medicaid Other | Admitting: Anesthesiology

## 2018-08-26 ENCOUNTER — Other Ambulatory Visit: Payer: Self-pay

## 2018-08-26 DIAGNOSIS — Z3483 Encounter for supervision of other normal pregnancy, third trimester: Secondary | ICD-10-CM | POA: Diagnosis present

## 2018-08-26 DIAGNOSIS — Z3A38 38 weeks gestation of pregnancy: Secondary | ICD-10-CM | POA: Diagnosis not present

## 2018-08-26 DIAGNOSIS — Z3400 Encounter for supervision of normal first pregnancy, unspecified trimester: Secondary | ICD-10-CM

## 2018-08-26 DIAGNOSIS — Z23 Encounter for immunization: Secondary | ICD-10-CM | POA: Diagnosis not present

## 2018-08-26 LAB — CBC
HCT: 29.2 % — ABNORMAL LOW (ref 36.0–46.0)
HEMOGLOBIN: 9.9 g/dL — AB (ref 12.0–15.0)
MCH: 28.1 pg (ref 26.0–34.0)
MCHC: 33.9 g/dL (ref 30.0–36.0)
MCV: 83 fL (ref 78.0–100.0)
PLATELETS: 203 10*3/uL (ref 150–400)
RBC: 3.52 MIL/uL — ABNORMAL LOW (ref 3.87–5.11)
RDW: 13.9 % (ref 11.5–15.5)
WBC: 8.3 10*3/uL (ref 4.0–10.5)

## 2018-08-26 LAB — TYPE AND SCREEN
ABO/RH(D): O POS
ANTIBODY SCREEN: NEGATIVE

## 2018-08-26 LAB — GROUP B STREP BY PCR: Group B strep by PCR: NEGATIVE

## 2018-08-26 LAB — OB RESULTS CONSOLE GBS: STREP GROUP B AG: NEGATIVE

## 2018-08-26 MED ORDER — SENNOSIDES-DOCUSATE SODIUM 8.6-50 MG PO TABS
2.0000 | ORAL_TABLET | ORAL | Status: DC
  Administered 2018-08-27 (×2): 2 via ORAL
  Filled 2018-08-26 (×2): qty 2

## 2018-08-26 MED ORDER — SIMETHICONE 80 MG PO CHEW: 80.0000 mg | CHEWABLE_TABLET | ORAL | Status: DC | PRN

## 2018-08-26 MED ORDER — OXYCODONE-ACETAMINOPHEN 5-325 MG PO TABS: 1.0000 | ORAL_TABLET | ORAL | Status: DC | PRN

## 2018-08-26 MED ORDER — LACTATED RINGERS IV SOLN: 500.0000 mL | INTRAVENOUS | Status: DC | PRN

## 2018-08-26 MED ORDER — PHENYLEPHRINE 40 MCG/ML (10ML) SYRINGE FOR IV PUSH (FOR BLOOD PRESSURE SUPPORT)
80.0000 ug | PREFILLED_SYRINGE | INTRAVENOUS | Status: DC | PRN
  Administered 2018-08-26: 80 ug via INTRAVENOUS
  Filled 2018-08-26: qty 5
  Filled 2018-08-26: qty 10

## 2018-08-26 MED ORDER — IBUPROFEN 600 MG PO TABS
600.0000 mg | ORAL_TABLET | Freq: Four times a day (QID) | ORAL | Status: DC
  Administered 2018-08-26 – 2018-08-28 (×7): 600 mg via ORAL
  Filled 2018-08-26 (×7): qty 1

## 2018-08-26 MED ORDER — EPHEDRINE 5 MG/ML INJ
10.0000 mg | INTRAVENOUS | Status: DC | PRN
  Filled 2018-08-26: qty 2

## 2018-08-26 MED ORDER — OXYTOCIN BOLUS FROM INFUSION
500.0000 mL | Freq: Once | INTRAVENOUS | Status: AC
  Administered 2018-08-26: 500 mL via INTRAVENOUS

## 2018-08-26 MED ORDER — LIDOCAINE HCL (PF) 1 % IJ SOLN
30.0000 mL | INTRAMUSCULAR | Status: DC | PRN
  Filled 2018-08-26 (×2): qty 30

## 2018-08-26 MED ORDER — ACETAMINOPHEN 325 MG PO TABS: 650.0000 mg | ORAL_TABLET | ORAL | Status: DC | PRN

## 2018-08-26 MED ORDER — TETANUS-DIPHTH-ACELL PERTUSSIS 5-2.5-18.5 LF-MCG/0.5 IM SUSP: 0.5000 mL | Freq: Once | INTRAMUSCULAR | Status: DC

## 2018-08-26 MED ORDER — OXYTOCIN 40 UNITS IN LACTATED RINGERS INFUSION - SIMPLE MED
1.0000 m[IU]/min | INTRAVENOUS | Status: DC
  Administered 2018-08-26: 2 m[IU]/min via INTRAVENOUS

## 2018-08-26 MED ORDER — ONDANSETRON HCL 4 MG/2ML IJ SOLN: 4.0000 mg | Freq: Four times a day (QID) | INTRAMUSCULAR | Status: DC | PRN

## 2018-08-26 MED ORDER — ONDANSETRON HCL 4 MG/2ML IJ SOLN: 4.0000 mg | INTRAMUSCULAR | Status: DC | PRN

## 2018-08-26 MED ORDER — PHENYLEPHRINE 40 MCG/ML (10ML) SYRINGE FOR IV PUSH (FOR BLOOD PRESSURE SUPPORT)
80.0000 ug | PREFILLED_SYRINGE | INTRAVENOUS | Status: DC | PRN
  Administered 2018-08-26 (×2): 80 ug via INTRAVENOUS
  Filled 2018-08-26: qty 5

## 2018-08-26 MED ORDER — DIPHENHYDRAMINE HCL 50 MG/ML IJ SOLN: 12.5000 mg | INTRAMUSCULAR | Status: DC | PRN

## 2018-08-26 MED ORDER — OXYTOCIN 40 UNITS IN LACTATED RINGERS INFUSION - SIMPLE MED
2.5000 [IU]/h | INTRAVENOUS | Status: DC
  Filled 2018-08-26: qty 1000

## 2018-08-26 MED ORDER — ZOLPIDEM TARTRATE 5 MG PO TABS: 5.0000 mg | ORAL_TABLET | Freq: Every evening | ORAL | Status: DC | PRN

## 2018-08-26 MED ORDER — LACTATED RINGERS IV SOLN
INTRAVENOUS | Status: DC
  Administered 2018-08-26 (×3): via INTRAVENOUS

## 2018-08-26 MED ORDER — FENTANYL 2.5 MCG/ML BUPIVACAINE 1/10 % EPIDURAL INFUSION (WH - ANES)
14.0000 mL/h | INTRAMUSCULAR | Status: DC | PRN
  Administered 2018-08-26: 14 mL/h via EPIDURAL
  Filled 2018-08-26: qty 100

## 2018-08-26 MED ORDER — ONDANSETRON HCL 4 MG PO TABS: 4.0000 mg | ORAL_TABLET | ORAL | Status: DC | PRN

## 2018-08-26 MED ORDER — LIDOCAINE-EPINEPHRINE (PF) 2 %-1:200000 IJ SOLN
INTRAMUSCULAR | Status: DC | PRN
  Administered 2018-08-26 (×2): 4 mL via EPIDURAL

## 2018-08-26 MED ORDER — DIPHENHYDRAMINE HCL 25 MG PO CAPS: 25.0000 mg | ORAL_CAPSULE | Freq: Four times a day (QID) | ORAL | Status: DC | PRN

## 2018-08-26 MED ORDER — COCONUT OIL OIL: 1.0000 "application " | TOPICAL_OIL | Status: DC | PRN

## 2018-08-26 MED ORDER — INFLUENZA VAC SPLIT QUAD 0.5 ML IM SUSY
0.5000 mL | PREFILLED_SYRINGE | INTRAMUSCULAR | Status: AC
  Administered 2018-08-28: 0.5 mL via INTRAMUSCULAR
  Filled 2018-08-26: qty 0.5

## 2018-08-26 MED ORDER — TERBUTALINE SULFATE 1 MG/ML IJ SOLN
0.2500 mg | Freq: Once | INTRAMUSCULAR | Status: DC | PRN
  Filled 2018-08-26: qty 1

## 2018-08-26 MED ORDER — LACTATED RINGERS IV SOLN
500.0000 mL | Freq: Once | INTRAVENOUS | Status: AC
  Administered 2018-08-26: 500 mL via INTRAVENOUS

## 2018-08-26 MED ORDER — WITCH HAZEL-GLYCERIN EX PADS: 1.0000 "application " | MEDICATED_PAD | CUTANEOUS | Status: DC | PRN

## 2018-08-26 MED ORDER — DIBUCAINE 1 % RE OINT: 1.0000 "application " | TOPICAL_OINTMENT | RECTAL | Status: DC | PRN

## 2018-08-26 MED ORDER — FLEET ENEMA 7-19 GM/118ML RE ENEM: 1.0000 | ENEMA | RECTAL | Status: DC | PRN

## 2018-08-26 MED ORDER — OXYCODONE-ACETAMINOPHEN 5-325 MG PO TABS: 2.0000 | ORAL_TABLET | ORAL | Status: DC | PRN

## 2018-08-26 MED ORDER — BENZOCAINE-MENTHOL 20-0.5 % EX AERO: 1.0000 "application " | INHALATION_SPRAY | CUTANEOUS | Status: DC | PRN

## 2018-08-26 MED ORDER — PRENATAL MULTIVITAMIN CH
1.0000 | ORAL_TABLET | Freq: Every day | ORAL | Status: DC
  Administered 2018-08-27 – 2018-08-28 (×2): 1 via ORAL
  Filled 2018-08-26 (×2): qty 1

## 2018-08-26 MED ORDER — SOD CITRATE-CITRIC ACID 500-334 MG/5ML PO SOLN: 30.0000 mL | ORAL | Status: DC | PRN

## 2018-08-26 NOTE — MAU Note (Signed)
Pt states ctx started around 0600 this am and reports 5 min apart.  Pt denies LOF or vag bleeding.  Pt. Reports good fetal movement.

## 2018-08-26 NOTE — Progress Notes (Signed)
LABOR PROGRESS NOTE  Alexandria [name] is a 22 y.o. G2P1001 at 9511w2d  admitted for SOL.   Subjective: Strip note.   Objective: BP (!) 97/51   Pulse 82   Temp 98.3 F (36.8 C) (Oral)   Resp 16   Ht 5\' 2"  (1.575 m)   Wt 70.3 kg   LMP 12/17/2017 (Exact Date)   SpO2 100%   BMI 28.35 kg/m  or  Vitals:   08/26/18 1630 08/26/18 1700 08/26/18 1730 08/26/18 1800  BP: (!) 99/59 (!) 98/47 (!) 101/54 (!) 97/51  Pulse: 73 77 77 82  Resp:  16    Temp:  98.3 F (36.8 C)    TempSrc:  Oral    SpO2:      Weight:      Height:       Dilation: 5.5 Effacement (%): 80 Cervical Position: Middle Station: -2 Presentation: Vertex Exam by:: Mary SwazilandJordan Johnson, RN  FHT: baseline rate 130, moderate varibility, +acel, one isolated decel Toco: q4-5 min   Labs: Lab Results  Component Value Date   WBC 8.3 08/26/2018   HGB 9.9 (L) 08/26/2018   HCT 29.2 (L) 08/26/2018   MCV 83.0 08/26/2018   PLT 203 08/26/2018    Patient Active Problem List   Diagnosis Date Noted  . Normal labor 08/26/2018  . Supervision of normal first pregnancy, antepartum 03/19/2018  . Currently pregnant in second trimester with unknown gestational age 51/18/2019  . Short interval between pregnancies affecting pregnancy in second trimester, antepartum 03/19/2018    Assessment / Plan: 22 y.o. G2P1001 at 2611w2d here for SOL.   Labor: Started augmentation with Pitocin due to minimal cervical change and inadequate ctx pattern.  Fetal Wellbeing:  Strip now Cat I with +acels and moderate variability. One isolated decel noted.  Pain Control:  Epidural in place.  Anticipated MOD:  NSVD  Marcy Sirenatherine Pearl Berlinger, D.O. OB Fellow  08/26/2018, 7:00 PM

## 2018-08-26 NOTE — Anesthesia Pain Management Evaluation Note (Signed)
  CRNA Pain Management Visit Note  Patient: Alexandria [name], 22 y.o., female  "Hello I am a member of the anesthesia team at [facility]. We have an anesthesia team available at all times to provide care throughout the hospital, including epidural management and anesthesia for C-section. I don't know your plan for the delivery whether it a natural birth, water birth, IV sedation, nitrous supplementation, doula or epidural, but we want to meet your pain goals."   1.Was your pain managed to your expectations on prior hospitalizations?   Yes   2.What is your expectation for pain management during this hospitalization?     Epidural  3.How can we help you reach that goal? unsure  Record the patient's initial score and the patient's pain goal.   Pain: 0  Pain Goal: 9 [facility] wants you to be able to say your pain was always managed very well.  Cephus Shelling 08/26/2018

## 2018-08-26 NOTE — H&P (Addendum)
OBSTETRIC ADMISSION HISTORY AND PHYSICAL  Yetunde Viviana Zareya Tuckett is a 22 y.o. female G2P1001 with IUP at [redacted]w[redacted]d by U/S on 07/17/18 presenting for increasing contractions that started this morning at 0600. She reports +FMs, No LOF, no VB, no blurry vision, headaches or peripheral edema, and RUQ pain. She denies any hx of gestational diabetes, preeclampsia or PPH with her first child, but states she pushed for 4 hours. She has NKDA.  She plans on breast feeding, and is interested in an IUD for birth control. She received her prenatal care at [facility]: [facility] Rhonda W   Dating: By U/S (07/17/18) at 32w 4d --->  Estimated Date of Delivery: 09/07/18  Sono:  @[redacted]w[redacted]d , CWD, normal anatomy but limited by advanced gestational age, Cephalic presentation, 2057g, 16% EFW  Nurse expressed SW concerns regarding potential trafficking based on hand markings on patient   Prenatal History/Complications:  Past Medical History: Past Medical History:  Diagnosis Date  . Medical history non-contributory     Past Surgical History: Past Surgical History:  Procedure Laterality Date  . NO PAST SURGERIES      Obstetrical History: OB History    Gravida  2   Para  1   Term  1   Preterm  0   AB  0   Living  1     SAB  0   TAB  0   Ectopic  0   Multiple  0   Live Births  1           Social History: Social History   Socioeconomic History  . Marital status: Single    Spouse name: Not on file  . Number of children: 1  . Years of education: Not on file  . Highest education level: 12th grade  Occupational History  . Not on file  Social Needs  . Financial resource strain: Not on file  . Food insecurity:    Worry: Not on file    Inability: Not on file  . Transportation needs:    Medical: No    Non-medical: No  Tobacco Use  . Smoking status: Never Smoker  . Smokeless tobacco: Never Used  Substance and Sexual Activity  . Alcohol use: No  . Drug use: No  . Sexual activity: Yes    Birth  control/protection: None  Lifestyle  . Physical activity:    Days per week: Not on file    Minutes per session: Not on file  . Stress: Not on file  Relationships  . Social connections:    Talks on phone: Never    Gets together: Never    Attends religious service: Never    Active member of club or organization: No    Attends meetings of clubs or organizations: Never    Relationship status: Not on file  Other Topics Concern  . Not on file  Social History Narrative  . Not on file    Family History: Family History  Problem Relation Age of Onset  . Diabetes Maternal Grandmother     Allergies: No Known Allergies  Medications Prior to Admission  Medication Sig Dispense Refill Last Dose  . acetaminophen (TYLENOL) 325 MG tablet Take 650 mg by mouth every 6 (six) hours as needed.   08/25/2018 at Unknown time  . Prenatal Vit-Fe Fumarate-FA (PRENATAL MULTIVITAMIN) TABS tablet Take 1 tablet by mouth daily at 12 noon.   More than a month at Unknown time     Review of Systems   All systems reviewed and  negative except as stated in HPI  Blood pressure (!) 95/55, pulse 92, temperature 98.6 F (37 C), temperature source Oral, resp. rate 18, height 5\' 2"  (1.575 m), weight 70.3 kg, last menstrual period 12/17/2017, SpO2 100 %, not currently breastfeeding. General appearance: alert, cooperative, appears stated age and no distress Lungs: clear to auscultation bilaterally Heart: regular rate and rhythm Abdomen: soft, non-tender; bowel sounds normal Fetal monitoringBaseline: 135 bpm, Variability: Good {> 6 bpm), Accelerations: Reactive and Decelerations: Absent  Dilation: 4.5 Effacement (%): 70, 80 Station: -2 Exam by:: Marvel Plan RN   Confirmed vertex presentation with bedside sono by Dr. Lorenza Burton.   Prenatal labs: ABO, Rh: --/--/O POS (09/25 1302) Antibody: NEG (09/25 1302) Rubella: 1.71 (04/18 1443) RPR: Non Reactive (08/09 0900)  HBsAg: Negative (04/18 1443)  HIV: Non  Reactive (08/09 0900)  GBS: Negative (10/11 0000)   2hr GTT: normal (80/175/143) Genetic screening: none to date Anatomy US: normal, but limited by gestational age  Prenatal Transfer Tool  Maternal Diabetes: No Genetic Screening: Declined Maternal Ultrasounds/Referrals: Normal Fetal Ultrasounds or other Referrals:  None Maternal Substance Abuse:  No Significant Maternal Medications:  None Significant Maternal Lab Results: Lab values include: Other: GBS pending  Results for orders placed or performed during the hospital encounter of 08/26/18 (from the past 24 hour(s))  CBC   Collection Time: 08/26/18  1:02 PM  Result Value Ref Range   WBC 8.3 4.0 - 10.5 K/uL   RBC 3.52 (L) 3.87 - 5.11 MIL/uL   Hemoglobin 9.9 (L) 12.0 - 15.0 g/dL   HCT 16.1 (L) 09.6 - 04.5 %   MCV 83.0 78.0 - 100.0 fL   MCH 28.1 26.0 - 34.0 pg   MCHC 33.9 30.0 - 36.0 g/dL   RDW 40.9 81.1 - 91.4 %   Platelets 203 150 - 400 K/uL  Type and screen [facility]   Collection Time: 08/26/18  1:02 PM  Result Value Ref Range   ABO/RH(D) O POS    Antibody Screen NEG    Sample Expiration      08/29/2018 Performed at [facility], [address], [place], [place] [address]     Patient Active Problem List   Diagnosis Date Noted  . Normal labor 08/26/2018  . Supervision of normal first pregnancy, antepartum 03/19/2018  . Currently pregnant in second trimester with unknown gestational age 08/19/2018  . Short interval between pregnancies affecting pregnancy in second trimester, antepartum 03/19/2018    Assessment/Plan:  Tereso Newcomer is a 22 y.o. G2P1001 at [redacted]w[redacted]d here for increased contractions, SOL.   #Labor: Contractions 5 mins apart. Continue to monitor. Expectant management--will augment with pitocin if ctx pattern unfavorable or cervical change inadequate.  #Pain: Epidural in place.  #FWB:  FHR: 135, Category 1, moderate variability, no decels #ID:  GBS status pending.  Patient GBS neg with previous pregnancy, no indications for empiric prophylaxis. GC/Chlamydia pending.  #MOF: breast #MOC:IUD #Circ:  N/A (Girl  #SW consult PP for social concerns.   Basilio Cairo, Medical Student  08/26/2018, 2:01 PM  OB FELLOW HISTORY AND PHYSICAL ATTESTATION  I have seen and examined this patient; I agree with above documentation in the medical student's note. I have edited as appropriate.   Gissell Mayline Dragon Solorzano is 22 y.o. G80P1001 female at [redacted]w[redacted]d who presents for SOL with good cervical change in MAU. Pregnancy complicated by limited PNC (no visits after 29 weeks) and short interval between pregnancies. Patient has unknown GBS status. PCR  has been collected. Patient was GBS neg with previous pregnancy and has no indications for empiric prophylaxis. FWB Cat I. Expectant management for labor. Epidural in place.   Marcy Siren, D.O. OB Fellow  08/26/2018, 3:50 PM

## 2018-08-26 NOTE — Anesthesia Preprocedure Evaluation (Signed)
Anesthesia Evaluation  Patient identified by MRN, date of birth, ID band Patient awake    Reviewed: Allergy & Precautions, NPO status , Patient's Chart, lab work & pertinent test results  Airway Mallampati: I  TM Distance: >3 FB Neck ROM: Full    Dental no notable dental hx. (+) Teeth Intact   Pulmonary neg pulmonary ROS,    Pulmonary exam normal breath sounds clear to auscultation       Cardiovascular negative cardio ROS Normal cardiovascular exam Rhythm:Regular Rate:Normal     Neuro/Psych negative neurological ROS  negative psych ROS   GI/Hepatic negative GI ROS, Neg liver ROS,   Endo/Other  negative endocrine ROS  Renal/GU negative Renal ROS  negative genitourinary   Musculoskeletal negative musculoskeletal ROS (+)   Abdominal   Peds  Hematology negative hematology ROS (+)   Anesthesia Other Findings   Reproductive/Obstetrics (+) Pregnancy                             Anesthesia Physical Anesthesia Plan  ASA: II  Anesthesia Plan: Spinal   Post-op Pain Management:    Induction:   PONV Risk Score and Plan: Treatment may vary due to age or medical condition  Airway Management Planned: Natural Airway  Additional Equipment:   Intra-op Plan:   Post-operative Plan:   Informed Consent: I have reviewed the patients History and Physical, chart, labs and discussed the procedure including the risks, benefits and alternatives for the proposed anesthesia with the patient or authorized representative who has indicated his/her understanding and acceptance.     Plan Discussed with:   Anesthesia Plan Comments:         Anesthesia Quick Evaluation

## 2018-08-26 NOTE — Anesthesia Procedure Notes (Signed)
Epidural Patient location during procedure: OB Start time: 08/26/2018 1:40 PM End time: 08/26/2018 1:55 PM  Staffing Anesthesiologist: Elmer PickerWoodrum, Kerri Asche L, MD Performed: anesthesiologist   Preanesthetic Checklist Completed: patient identified, pre-op evaluation, timeout performed, IV checked, risks and benefits discussed and monitors and equipment checked  Epidural Patient position: sitting Prep: site prepped and draped and DuraPrep Patient monitoring: continuous pulse ox, blood pressure, heart rate and cardiac monitor Approach: midline Location: L3-L4 Injection technique: LOR air  Needle:  Needle type: Tuohy  Needle gauge: 17 G Needle length: 9 cm Needle insertion depth: 4.5 cm Catheter type: closed end flexible Catheter size: 19 Gauge Catheter at skin depth: 10 cm Test dose: negative  Assessment Sensory level: T8 Events: blood not aspirated, injection not painful, no injection resistance, negative IV test and no paresthesia  Additional Notes Patient identified. Risks/Benefits/Options discussed with patient including but not limited to bleeding, infection, nerve damage, paralysis, failed block, incomplete pain control, headache, blood pressure changes, nausea, vomiting, reactions to medication both or allergic, itching and postpartum back pain. Confirmed with bedside nurse the patient's most recent platelet count. Confirmed with patient that they are not currently taking any anticoagulation, have any bleeding history or any family history of bleeding disorders. Patient expressed understanding and wished to proceed. All questions were answered. Sterile technique was used throughout the entire procedure. Please see nursing notes for vital signs. Test dose was given through epidural catheter and negative prior to continuing to dose epidural or start infusion. Warning signs of high block given to the patient including shortness of breath, tingling/numbness in hands, complete motor block,  or any concerning symptoms with instructions to call for help. Patient was given instructions on fall risk and not to get out of bed. All questions and concerns addressed with instructions to call with any issues or inadequate analgesia.  Reason for block:procedure for pain

## 2018-08-27 LAB — GC/CHLAMYDIA PROBE AMP (~~LOC~~) NOT AT ARMC
Chlamydia: NEGATIVE
Neisseria Gonorrhea: NEGATIVE

## 2018-08-27 LAB — RPR: RPR Ser Ql: NONREACTIVE

## 2018-08-27 NOTE — Anesthesia Postprocedure Evaluation (Signed)
Anesthesia Post Note  Patient: Haematologist Solorzano  Procedure(s) Performed: AN AD HOC LABOR EPIDURAL     Patient location during evaluation: Mother Baby Anesthesia Type: Epidural Level of consciousness: awake, awake and alert and oriented Pain management: pain level controlled Vital Signs Assessment: post-procedure vital signs reviewed and stable Respiratory status: spontaneous breathing and respiratory function stable Cardiovascular status: blood pressure returned to baseline and stable Postop Assessment: no headache, epidural receding, patient able to bend at knees, adequate PO intake, no backache and able to ambulate Anesthetic complications: no    Last Vitals:  Vitals:   08/27/18 0400 08/27/18 0757  BP: 106/72 (!) 106/51  Pulse: 70 74  Resp: 16 16  Temp: 36.9 C   SpO2: 100% 100%    Last Pain:  Vitals:   08/27/18 0757  TempSrc:   PainSc: 0-No pain   Pain Goal: Patients Stated Pain Goal: 1 (08/26/18 2204)               Cleda Clarks

## 2018-08-27 NOTE — Lactation Note (Signed)
This note was copied from a baby's chart. Lactation Consultation Note  Patient Name: Girl Cheryln Balcom [id number] Date: 08/27/2018 Reason for consult: Initial assessment;Early term 37-38.6wks;Other (Comment)(Language preference - English - per mom plans to pump and bottle feed only ) Baby is 21 hours old  Per mom has been pumping and 1st time pumping got milk and since no milk.  LC reviewed supply and demand and shared with mom that is normal and the  Volume will be up and down until the milk comes in. Consistency is the key to get the  Milk coming in , 8-10 x's a day both breast for 15 -20 mins.  Per mom mentioned the #24 F is comfortable.  Per mom has DEBP - Medela at home.  Sore nipple and engorgement prevention and tx reviewed.  Mother informed of post-discharge support and given phone number to the lactation department, including services for phone call assistance; out-patient appointments; and breastfeeding support group. List of other breastfeeding resources in the community given in the handout. Encouraged mother to call for problems or concerns related to breastfeeding.  Maternal Data    Feeding Feeding Type: Bottle Fed - Formula Nipple Type: Slow - flow  LATCH Score                   Interventions Interventions: Breast feeding basics reviewed;DEBP  Lactation Tools Discussed/Used Tools: Pump;Flanges Flange Size: 24;Other (comment)(per mom mentioned the #24 F is comfortable ) Breast pump type: Double-Electric Breast Pump WIC Program: No   Consult Status Consult Status: PRN Date: 08/28/18 Follow-up type: In-patient    Matilde Sprang Iyanah Demont 08/27/2018, 6:07 PM

## 2018-08-27 NOTE — Progress Notes (Signed)
Post Partum Day 1 Subjective: no complaints, up ad lib, voiding and tolerating PO  Objective: Blood pressure 106/72, pulse 70, temperature 98.4 F (36.9 C), temperature source Oral, resp. rate 16, height 5\' 2"  (1.575 m), weight 70.3 kg, last menstrual period 12/17/2017, SpO2 100 %, unknown if currently breastfeeding.  Physical Exam:  General: alert, cooperative and no distress Lochia: appropriate Uterine Fundus: firm Incision: n/a DVT Evaluation: No evidence of DVT seen on physical exam.  Recent Labs    08/26/18 1302  HGB 9.9*  HCT 29.2*    Assessment/Plan: Plan for discharge tomorrow, Breastfeeding and Contraception LARC, IUD   LOS: 1 day   Wynelle Bourgeois 08/27/2018, 6:24 AM

## 2018-08-27 NOTE — Progress Notes (Signed)
CSW received consult for late and limited PNC.  CSW reviewed chart and is screening out consult as it does not meet criteria for automatic CSW involvement and infant drug screening.  MOB started care prior to 28 weeks and had 3 visits.  Please contact CSW if current concerns arise or by MOB's request.  Elyzabeth Goatley Boyd-Gilyard, MSW, LCSW Clinical Social Work [phone] 

## 2018-08-28 DIAGNOSIS — Z3A38 38 weeks gestation of pregnancy: Secondary | ICD-10-CM

## 2018-08-28 MED ORDER — IBUPROFEN 600 MG PO TABS: 600.0000 mg | ORAL_TABLET | Freq: Four times a day (QID) | ORAL | 0 refills | Status: DC

## 2018-08-28 NOTE — Discharge Summary (Addendum)
OB Discharge Summary     Patient Name: Alexandria [name] [facility] DOB: [birth year] MRN: [id number]  Date of admission: 08/26/2018 Delivering MD: Mirian Mo   Date of discharge: 08/28/2018  Admitting diagnosis: 39wks ctx Intrauterine pregnancy: [redacted]w[redacted]d     Secondary diagnosis:  Active Problems:   Normal labor  Additional problems: none     Discharge diagnosis: Term Pregnancy Delivered                                                                                                Post partum procedures:none  Augmentation: Pitocin  Complications: None  Hospital course:  Onset of Labor With Vaginal Delivery     22 y.o. yo [id number] at [redacted]w[redacted]d was admitted in Latent Labor on 08/26/2018. Patient had an uncomplicated labor course as follows:  Membrane Rupture Time/Date: 7:16 PM ,08/26/2018   Intrapartum Procedures: Episiotomy: None [1]                                         Lacerations:  None [1]  Patient had a delivery of a Viable infant. 08/26/2018  Information for the patient's newborn:  Alexandria [name], [name] [[id number]]  Delivery Method: Vaginal, Spontaneous(Filed from Delivery Summary)    Pateint had an uncomplicated postpartum course.  She is ambulating, tolerating a regular diet, passing flatus, and urinating well. Patient is discharged home in stable condition on 08/28/18.   Physical exam  Vitals:   08/27/18 0757 08/27/18 1412 08/27/18 2130 08/28/18 0600  BP: (!) 106/51 (!) 106/52 107/70 113/67  Pulse: 74 83 75 77  Resp: 16 17 16 18   Temp:  98.6 F (37 C) 98.6 F (37 C) 98.6 F (37 C)  TempSrc:  Oral Oral Oral  SpO2: 100%  100%   Weight:      Height:       General: alert, cooperative and no distress Lochia: appropriate Uterine Fundus: firm Incision: N/A DVT Evaluation: No evidence of DVT seen on physical exam. Labs: Lab Results  Component Value Date   WBC 8.3 08/26/2018   HGB 9.9 (L) 08/26/2018   HCT 29.2 (L) 08/26/2018   MCV 83.0 08/26/2018    PLT 203 08/26/2018   No flowsheet data found.  Discharge instruction: per After Visit Summary and "Baby and Me Booklet".  After visit meds:  Allergies as of 08/28/2018   No Known Allergies     Medication List    TAKE these medications   acetaminophen 325 MG tablet Commonly known as:  TYLENOL Take 650 mg by mouth every 6 (six) hours as needed.   ibuprofen 600 MG tablet Commonly known as:  ADVIL,MOTRIN Take 1 tablet (600 mg total) by mouth every 6 (six) hours.   prenatal multivitamin Tabs tablet Take 1 tablet by mouth daily at 12 noon.       Diet: routine diet  Activity: Advance as tolerated. Pelvic rest for 6 weeks.   Outpatient follow up:6 weeks Follow up Appt: Future Appointments  Date Time Provider  Department Center  10/01/2018  9:15 AM Tamera Stands, DO [facility]   Follow up Visit:No follow-ups on file.  Postpartum contraception: IUD Mirena vs depo  Newborn Data: Live born female  Birth Weight: 6 lb 12.3 oz (3070 g) APGAR: 8, 9  Newborn Delivery   Birth date/time:  08/26/2018 20:39:00 Delivery type:  Vaginal, Spontaneous     Baby Feeding: Bottle Disposition:home with mother  08/28/2018 Denzil Hughes, MD  Attestation: I agree with the resident's documentation. We have discussed the plan of care.  Cristal Deer. Earlene Plater, DO OB/GYN Fellow

## 2018-08-28 NOTE — Discharge Instructions (Signed)
Domestic Violence Information  What is domestic violence?  Domestic violence, also called intimate partner violence, can involve physical, emotional, psychological, sexual, and economic abuse by a current or former intimate partner. Stalking is also considered a type of domestic violence. Domestic violence can happen between people who are or were:  · Married.  · Dating.  · Living together.    Abusers repeatedly act to maintain control and power over their partner.  Physical abuse  Physical abuse can include:  · Slapping.  · Hitting.  · Kicking.  · Punching.  · Choking.  · Pulling the victim’s hair.  · Damaging the victim’s property.  · Threatening or hurting the victim with weapons.  · Trapping the victim in his or her home.  · Forcing the victim to use drugs or alcohol.    Emotional and psychological abuse  Emotional and psychological abuse can include:  · Threats.  · Insults.  · Isolation.  · Humiliation.  · Jealousy and possessiveness.  · Blame.  · Withholding affection.  · Intimidation.  · Manipulation.  · Limiting contact with friends and family.    Sexual abuse  Sexual abuse can include:  · Forcing sex.  · Forcing sexual touching.  · Hurting the victim during sex.  · Forcing the victim to have sex with other people.  · Giving the victim a sexually transmitted disease (STD) on purpose.    Economic abuse  Economic abuse can include:  · Controlling resources, such as money, food, transportation, a phone, or computer.  · Stealing money from the victim or his or her family or friends.  · Forbidding the victim to work.  · Refusing to work or to contribute to the household.    Stalking  Stalking can include:  · Making repeated, unwanted phone calls, emails, or text messages.  · Leaving cards, letters, flowers or other items the victim does not want.  · Watching or following the victim from a distance.  · Going to places where the victim does not want the abuser.  · Entering the victim’s home or car.  · Damaging the  victim’s personal property.    What are some warning signs of domestic violence?  Physical signs  · Bruises.  · Broken bones.  · Burns or cuts.  · Physical pain.  · Head injury.  Emotional and psychological signs  · Crying.  · Depression.  · Hopelessness.  · Desperation.  · Trouble sleeping.  · Fear of partner.  · Anxiety.  · Suicidal behavior.  · Antisocial behavior.  · Low self-esteem.  · Fear of intimacy.  · Flashbacks.  Sexual signs  · Bruising, swelling, or bleeding of the genital or rectal area.  · Signs of a sexually transmitted infection, such as genital sores, warts, or discharge coming from the genital area.  · Pain in the genital area.  · Unintended pregnancy.  · Problems with pregnancy, including delayed prenatal care and prematurity.  Economic signs  · Having little money or food.  · Homelessness.  · Asking for or borrowing money.  What are common behaviors of those affected by domestic violence?  Those affected by domestic violence may:  · Be late to work or other events.  · Not show up to places as promised.  · Have to let their partner know where they are and who they are with.  · Be isolated or kept from seeing friends or family.  · Make comments about their partner's temper or behavior.  ·   They are trapped.  Their partner would take away their children.  They are emotionally drained or numb.  Their life is in danger.  They might have to  kill their partner to survive.  Where can you get help? Domestic violence hotlines and websites If you do not feel safe searching for help online at home, use a computer at [employer] to access [employer]. Call 911 if you are in immediate danger or need medical help.  [employer]. ? The 24-hour phone hotline is 815-815-7478 or 413-136-3756 (TTY). ? The videophone is available Monday through Friday, 9 a.m. to 5 p.m. Call (613)471-8732. ? The website is http://thehotline.org  The National Sexual Assault Hotline. ? The 24-hour phone hotline is 8080948529. ? You can access the online hotline at [employer].nl  Shelters for victims of domestic violence If you are a victim of domestic violence, there are resources to help you find a temporary place for you and your children to live (shelter). The specific address of these shelters is often not known to the public. Police Report assaults, threats, and stalking to the police. Counselors and counseling centers People who have been victims of domestic violence can benefit from counseling. Counseling can help you cope with difficult emotions and empower you to plan for your future safety. The topics you discuss with a counselor are private and confidential. Children of domestic violence victims also might need counseling to manage stress and anxiety. The court system You can work with a Clinical research associate or an advocate to get legal protection against an abuser. Protection includes restraining orders and private addresses. Crimes against you, such as assault, can also be prosecuted through the courts. Laws vary by state. This information is not intended to replace advice given to you by your health care provider. Make sure you discuss any questions you have with your health care provider. Document Released: 02/08/2004 Document Revised: 11/01/2016 Document Reviewed: 08/05/2014 Elsevier Interactive Patient  Education  2018 Elsevier Inc. Vaginal Delivery, Care After Refer to this sheet in the next few weeks. These instructions provide you with information about caring for yourself after vaginal delivery. Your health care provider may also give you more specific instructions. Your treatment has been planned according to current medical practices, but problems sometimes occur. Call your health care provider if you have any problems or questions. What can I expect after the procedure? After vaginal delivery, it is common to have:  Some bleeding from your vagina.  Soreness in your abdomen, your vagina, and the area of skin between your vaginal opening and your anus (perineum).  Pelvic cramps.  Fatigue.  Follow these instructions at home: Medicines  Take over-the-counter and prescription medicines only as told by your health care provider.  If you were prescribed an antibiotic medicine, take it as told by your health care provider. Do not stop taking the antibiotic until it is finished. Driving   Do not drive or operate heavy machinery while taking prescription pain medicine.  Do not drive for 24 hours if you received a sedative. Lifestyle  Do not drink alcohol. This is especially important if you are breastfeeding or taking medicine to relieve pain.  Do not use tobacco products, including cigarettes, chewing tobacco, or e-cigarettes. If you need help quitting, ask your health care provider. Eating and drinking  Drink at least 8 eight-ounce glasses of water every day unless you are told not to by your health care provider. If you choose to breastfeed your baby,  you may need to drink more water than this.  Eat high-fiber foods every day. These foods may help prevent or relieve constipation. High-fiber foods include: ? Whole grain cereals and breads. ? Brown rice. ? Beans. ? Fresh fruits and vegetables. Activity  Return to your normal activities as told by your health care provider.  Ask your health care provider what activities are safe for you.  Rest as much as possible. Try to rest or take a nap when your baby is sleeping.  Do not lift anything that is heavier than your baby or 10 lb (4.5 kg) until your health care provider says that it is safe.  Talk with your health care provider about when you can engage in sexual activity. This may depend on your: ? Risk of infection. ? Rate of healing. ? Comfort and desire to engage in sexual activity. Vaginal Care  If you have an episiotomy or a vaginal tear, check the area every day for signs of infection. Check for: ? More redness, swelling, or pain. ? More fluid or blood. ? Warmth. ? Pus or a bad smell.  Do not use tampons or douches until your health care provider says this is safe.  Watch for any blood clots that may pass from your vagina. These may look like clumps of dark red, brown, or black discharge. General instructions  Keep your perineum clean and dry as told by your health care provider.  Wear loose, comfortable clothing.  Wipe from front to back when you use the toilet.  Ask your health care provider if you can shower or take a bath. If you had an episiotomy or a perineal tear during labor and delivery, your health care provider may tell you not to take baths for a certain length of time.  Wear a bra that supports your breasts and fits you well.  If possible, have someone help you with household activities and help care for your baby for at least a few days after you leave the hospital.  Keep all follow-up visits for you and your baby as told by your health care provider. This is important. Contact a health care provider if:  You have: ? Vaginal discharge that has a bad smell. ? Difficulty urinating. ? Pain when urinating. ? A sudden increase or decrease in the frequency of your bowel movements. ? More redness, swelling, or pain around your episiotomy or vaginal tear. ? More fluid or blood  coming from your episiotomy or vaginal tear. ? Pus or a bad smell coming from your episiotomy or vaginal tear. ? A fever. ? A rash. ? Little or no interest in activities you used to enjoy. ? Questions about caring for yourself or your baby.  Your episiotomy or vaginal tear feels warm to the touch.  Your episiotomy or vaginal tear is separating or does not appear to be healing.  Your breasts are painful, hard, or turn red.  You feel unusually sad or worried.  You feel nauseous or you vomit.  You pass large blood clots from your vagina. If you pass a blood clot from your vagina, save it to show to your health care provider. Do not flush blood clots down the toilet without having your health care provider look at them.  You urinate more than usual.  You are dizzy or light-headed.  You have not breastfed at all and you have not had a menstrual period for 12 weeks after delivery.  You have stopped breastfeeding and you have  not had a menstrual period for 12 weeks after you stopped breastfeeding. Get help right away if:  You have: ? Pain that does not go away or does not get better with medicine. ? Chest pain. ? Difficulty breathing. ? Blurred vision or spots in your vision. ? Thoughts about hurting yourself or your baby.  You develop pain in your abdomen or in one of your legs.  You develop a severe headache.  You faint.  You bleed from your vagina so much that you fill two sanitary pads in one hour. This information is not intended to replace advice given to you by your health care provider. Make sure you discuss any questions you have with your health care provider. Document Released: 11/15/2000 Document Revised: 05/01/2016 Document Reviewed: 12/03/2015 Elsevier Interactive Patient Education  2018 [employer].

## 2018-10-01 ENCOUNTER — Ambulatory Visit: Payer: Self-pay | Admitting: Family Medicine

## 2019-01-21 ENCOUNTER — Ambulatory Visit: Payer: Self-pay | Admitting: Allergy & Immunology

## 2021-10-03 ENCOUNTER — Encounter: Payer: Self-pay | Admitting: *Deleted

## 2021-12-02 NOTE — L&D Delivery Note (Signed)
Delivery Note ?Soon after AROM, pt developed an urge to push.  After a 10 minute 2nd stage, at 7:12 PM a viable female was delivered via Vaginal, Spontaneous (Presentation: Middle Occiput Anterior). Pt was pretty shocked because someone in [place] had told her it was going to be a boy.  APGAR: 9, 9; weight pending. After 1 minute, the cord was clamped and cut. 40 units of pitocin diluted in 1000cc LR was infused rapidly IV.  The placenta separated spontaneously and delivered via CCT and maternal pushing effort.  It was inspected and appears to be intact with a 3 VC. She had several bouts of brisk bleeding, so cytotec was given PR.  FF, bleeding normalized. ? ? ?Anesthesia: Epidural ?Episiotomy: None ?Lacerations: None ?Suture Repair:  ?Est. Blood Loss (mL):  550 ? ?Mom to postpartum.  Baby to Couplet care / Skin to Skin. ? ?Jacklyn Shell ?03/21/2022, 7:36 PM ? ? ? ?

## 2022-03-05 ENCOUNTER — Inpatient Hospital Stay (HOSPITAL_COMMUNITY)
Admission: AD | Admit: 2022-03-05 | Discharge: 2022-03-05 | Disposition: A | Discharge status: Home / Self Care | Payer: Self-pay | Attending: Obstetrics & Gynecology | Admitting: Obstetrics & Gynecology

## 2022-03-05 ENCOUNTER — Encounter (HOSPITAL_COMMUNITY): Payer: Self-pay | Admitting: *Deleted

## 2022-03-05 DIAGNOSIS — O26893 Other specified pregnancy related conditions, third trimester: Secondary | ICD-10-CM | POA: Insufficient documentation

## 2022-03-05 DIAGNOSIS — I959 Hypotension, unspecified: Secondary | ICD-10-CM | POA: Insufficient documentation

## 2022-03-05 DIAGNOSIS — R410 Disorientation, unspecified: Secondary | ICD-10-CM | POA: Insufficient documentation

## 2022-03-05 DIAGNOSIS — Z3A37 37 weeks gestation of pregnancy: Secondary | ICD-10-CM | POA: Insufficient documentation

## 2022-03-05 DIAGNOSIS — O0933 Supervision of pregnancy with insufficient antenatal care, third trimester: Secondary | ICD-10-CM | POA: Insufficient documentation

## 2022-03-05 DIAGNOSIS — R42 Dizziness and giddiness: Secondary | ICD-10-CM | POA: Insufficient documentation

## 2022-03-05 DIAGNOSIS — R55 Syncope and collapse: Secondary | ICD-10-CM | POA: Insufficient documentation

## 2022-03-05 LAB — CBC
HCT: 30.3 % — ABNORMAL LOW (ref 36.0–46.0)
Hemoglobin: 9.7 g/dL — ABNORMAL LOW (ref 12.0–15.0)
MCH: 26.8 pg (ref 26.0–34.0)
MCHC: 32 g/dL (ref 30.0–36.0)
MCV: 83.7 fL (ref 80.0–100.0)
Platelets: 213 10*3/uL (ref 150–400)
RBC: 3.62 MIL/uL — ABNORMAL LOW (ref 3.87–5.11)
RDW: 13.7 % (ref 11.5–15.5)
WBC: 9.5 10*3/uL (ref 4.0–10.5)
nRBC: 0 % (ref 0.0–0.2)

## 2022-03-05 LAB — WET PREP, GENITAL
Clue Cells Wet Prep HPF POC: NONE SEEN
Sperm: NONE SEEN
Trich, Wet Prep: NONE SEEN
WBC, Wet Prep HPF POC: >=10 — AB (ref <10)
Yeast Wet Prep HPF POC: NONE SEEN

## 2022-03-05 LAB — URINALYSIS, ROUTINE W REFLEX MICROSCOPIC
Bilirubin Urine: NEGATIVE
Glucose, UA: 50 mg/dL — AB
Hgb urine dipstick: NEGATIVE
Ketones, ur: NEGATIVE mg/dL
Nitrite: NEGATIVE
Protein, ur: 100 mg/dL — AB
Specific Gravity, Urine: 1.027 (ref 1.005–1.030)
pH: 5 (ref 5.0–8.0)

## 2022-03-05 LAB — GLUCOSE, CAPILLARY: Glucose-Capillary: 140 mg/dL — ABNORMAL HIGH (ref 70–99)

## 2022-03-05 LAB — HIV ANTIBODY (ROUTINE TESTING W REFLEX): HIV Screen 4th Generation wRfx: NONREACTIVE

## 2022-03-05 LAB — ABO/RH: ABO/RH(D): O POS

## 2022-03-05 LAB — HEPATITIS B SURFACE ANTIGEN: Hepatitis B Surface Ag: NONREACTIVE

## 2022-03-05 NOTE — MAU Note (Signed)
.  Alexandria [name] is a 26 y.o. at Unknown here in MAU reporting: EMS arrival. Pt was [place] up in a park with symptoms of feeling dizzy and "out of it" intimal b/p in the field was 80/60. Pt was responsive but a little confused. IV established  and 563fluid bolus given on route to MAU. B/P on arrival is 101/64 . Pt is A/A/ O x4 and reports she feels better. CBG in field was 234 but pt ws eating marshmellows at the time. [facility] 03/21/22.Pt stated she has had some prenatal care in [place] but has not started any here in [place] His trying to get an appointment for Med center. C/o pelvic pressure but stated she has had that for months unchanged today. Good fetal movement reported. Admits to not eating or drinking much today. Felt she got to hot at the park and started feeling bad.  ?Onset of complaint: 1hr ago ?Pain score: 0 ?Vitals:  ? 03/05/22 1737  ?BP: 101/64  ?Pulse: (!) 101  ?Resp: 18  ?Temp: 98.5 ?F (36.9 ?C)  ?   ?FHT:150 ?Lab orders placed from triage:  u/a, CBG ? ?

## 2022-03-05 NOTE — MAU Provider Note (Signed)
?History  ?  ? ?CSN: [id number] ? ?Arrival date and time: 03/05/22 1722 ? ? Event Date/Time  ? First Provider Initiated Contact with Patient 03/05/22 1755   ?  ? ?Chief Complaint  ?Patient presents with  ? Hypotension  ? ?Ms. Alexandria [name] is a 26 y.o. year old G62P2002 female at [redacted]w[redacted]d weeks gestation who presents to MAU via EMS reporting feeling really hot, dizzy and "[place] of [place]" [place]. When EMS arrived on the scene, her BP was 80/60. Her CBG was 234 while in the park by EMS; eating marshmallows at the time of assessment per EMS. She reports she "hadn't eaten since 2000 last night." She was responsive, but confused. EMS started an IV and gave her a 500 ml bolus. BP improved upon arrival to MAU. She reports she "feels better." She reports good (+) FM. She has had some PNC in [place], but none in [place]. She has been trying to get an appt with [facility], but has not had any luck. ? ? ?OB History   ? ? Gravida  ?3  ? Para  ?2  ? Term  ?2  ? Preterm  ?0  ? AB  ?0  ? Living  ?2  ?  ? ? SAB  ?0  ? IAB  ?0  ? Ectopic  ?0  ? Multiple  ?0  ? Live Births  ?2  ?   ?  ?  ? ? ?Past Medical History:  ?Diagnosis Date  ? Medical history non-contributory   ? ? ?Past Surgical History:  ?Procedure Laterality Date  ? NO PAST SURGERIES    ? ? ?Family History  ?Problem Relation Age of Onset  ? Diabetes Maternal Grandmother   ? ? ?Social History  ? ?Tobacco Use  ? Smoking status: Never  ? Smokeless tobacco: Never  ?Substance Use Topics  ? Alcohol use: No  ? Drug use: No  ? ? ?Allergies: No Known Allergies ? ?Medications Prior to Admission  ?Medication Sig Dispense Refill Last Dose  ? Prenatal Vit-Fe Fumarate-FA (PRENATAL MULTIVITAMIN) TABS tablet Take 1 tablet by mouth daily at 12 noon.   Past Month  ? acetaminophen (TYLENOL) 325 MG tablet Take 650 mg by mouth every 6 (six) hours as needed.   More than a month  ? ibuprofen (ADVIL,MOTRIN) 600 MG tablet Take 1 tablet (600 mg total) by mouth every 6 (six) hours. 30  tablet 0   ? ? ?Review of Systems  ?Constitutional: Negative.   ?HENT: Negative.    ?Eyes: Negative.   ?Respiratory: Negative.    ?Cardiovascular: Negative.   ?Gastrointestinal: Negative.   ?Endocrine: Negative.   ?Genitourinary: Negative.   ?Musculoskeletal: Negative.   ?Skin: Negative.   ?Allergic/Immunologic: Negative.   ?Neurological:  Positive for dizziness.  ?Hematological: Negative.   ?Psychiatric/Behavioral: Negative.    ?Physical Exam  ? ?Blood pressure 101/64, pulse (!) 101, temperature 98.5 ?F (36.9 ?C), resp. rate 18, unknown if currently breastfeeding. ? ?Physical Exam ?Vitals and nursing note reviewed.  ?Constitutional:   ?   Appearance: Normal appearance. She is normal weight.  ?Cardiovascular:  ?   Rate and Rhythm: Tachycardia present.  ?Pulmonary:  ?   Effort: Pulmonary effort is normal.  ?Abdominal:  ?   Palpations: Abdomen is soft.  ?Neurological:  ?   Mental Status: She is alert and oriented to person, place, and time.  ?Psychiatric:     ?   Mood and Affect: Mood normal.     ?  Behavior: Behavior normal.     ?   Thought Content: Thought content normal.     ?   Judgment: Judgment normal.  ? ?REACTIVE NST - FHR: 145 bpm / moderate variability / accels present / decels absent / TOCO: irregular UC's ?MAU Course  ?Procedures ? ?MDM ?CCUA ?UCx -- Results pending  ?EFM ?CBG ?Prenatal Labs ?GBS -- Results pending  ?GC/CT -- Results pending  ? ?Results for orders placed or performed during the hospital encounter of 03/05/22 (from the past 24 hour(s))  ?Urinalysis, Routine w reflex microscopic Urine, Clean Catch     Status: Abnormal  ? Collection Time: 03/05/22  5:44 PM  ?Result Value Ref Range  ? Color, Urine AMBER (A) YELLOW  ? APPearance CLOUDY (A) CLEAR  ? Specific Gravity, Urine 1.027 1.005 - 1.030  ? pH 5.0 5.0 - 8.0  ? Glucose, UA 50 (A) NEGATIVE mg/dL  ? Hgb urine dipstick NEGATIVE NEGATIVE  ? Bilirubin Urine NEGATIVE NEGATIVE  ? Ketones, ur NEGATIVE NEGATIVE mg/dL  ? Protein, ur 100 (A) NEGATIVE  mg/dL  ? Nitrite NEGATIVE NEGATIVE  ? Leukocytes,Ua SMALL (A) NEGATIVE  ? RBC / HPF 0-5 0 - 5 RBC/hpf  ? WBC, UA 11-20 0 - 5 WBC/hpf  ? Bacteria, UA MANY (A) NONE SEEN  ? Squamous Epithelial / LPF 11-20 0 - 5  ? Mucus PRESENT   ? Hyaline Casts, UA PRESENT   ? Granular Casts, UA PRESENT   ?Glucose, capillary     Status: Abnormal  ? Collection Time: 03/05/22  5:50 PM  ?Result Value Ref Range  ? Glucose-Capillary 140 (H) 70 - 99 mg/dL  ?CBC     Status: Abnormal  ? Collection Time: 03/05/22  7:06 PM  ?Result Value Ref Range  ? WBC 9.5 4.0 - 10.5 K/uL  ? RBC 3.62 (L) 3.87 - 5.11 MIL/uL  ? Hemoglobin 9.7 (L) 12.0 - 15.0 g/dL  ? HCT 30.3 (L) 36.0 - 46.0 %  ? MCV 83.7 80.0 - 100.0 fL  ? MCH 26.8 26.0 - 34.0 pg  ? MCHC 32.0 30.0 - 36.0 g/dL  ? RDW 13.7 11.5 - 15.5 %  ? Platelets 213 150 - 400 K/uL  ? nRBC 0.0 0.0 - 0.2 %  ?ABO/Rh     Status: None  ? Collection Time: 03/05/22  7:06 PM  ?Result Value Ref Range  ? ABO/RH(D) O POS   ? No rh immune globuloin    ?  NOT A RH IMMUNE GLOBULIN CANDIDATE, PT RH POSITIVE ?Performed at [facility], [address] [place], [place] [address] ?  ?HIV Antibody (routine testing w rflx)     Status: None  ? Collection Time: 03/05/22  7:06 PM  ?Result Value Ref Range  ? HIV Screen 4th Generation wRfx Non Reactive Non Reactive  ?Hepatitis B surface antigen     Status: None  ? Collection Time: 03/05/22  7:06 PM  ?Result Value Ref Range  ? Hepatitis B Surface Ag NON REACTIVE NON REACTIVE  ?Wet prep, genital     Status: Abnormal  ? Collection Time: 03/05/22  7:18 PM  ? Specimen: Vaginal  ?Result Value Ref Range  ? Yeast Wet Prep HPF POC NONE SEEN NONE SEEN  ? Trich, Wet Prep NONE SEEN NONE SEEN  ? Clue Cells Wet Prep HPF POC NONE SEEN NONE SEEN  ? WBC, Wet Prep HPF POC >=10 (A) <10  ? Sperm NONE SEEN   ?  ?Assessment and Plan  ?  No prenatal care in current pregnancy in third trimester  ?- Advised to call to schedule [facility]  ? ?Near syncope ?- Advised to eat small frequent  meals/snacks every 2-3 hours prn ?- Advised to drink at least 8-10 16 oz bottles/cups of water daily ? ?[redacted] weeks gestation of pregnancy  ? ?- Discharge patient ?- [facility]  ?- Patient verbalized an understanding of the plan of care and agrees.  ? ?Raelyn Moraolitta Dessire Grimes, CNM ?03/05/2022, 5:55 PM  ?

## 2022-03-05 NOTE — Discharge Instructions (Signed)
It is important to eat small frequent meals/snacks every 2-3 hours and drink at least 8-10 16 ounce bottle or cups of water daily. ? ?Safe Medications in Pregnancy  ? ?Acne: ?Benzoyl Peroxide ?Salicylic Acid ? ?Backache/Headache: ?Tylenol: 2 regular strength every 4 hours OR ?             2 Extra strength every 6 hours ? ?Colds/Coughs/Allergies: ?Benadryl (alcohol free) 25 mg every 6 hours as needed ?Breath right strips ?Claritin ?Cepacol throat lozenges ?Chloraseptic throat spray ?Cold-Eeze- up to three times per day ?Cough drops, alcohol free ?Flonase (by prescription only) ?Guaifenesin ?Mucinex ?Robitussin DM (plain only, alcohol free) ?Saline nasal spray/drops ?Sudafed (pseudoephedrine) & Actifed ** use only after [redacted] weeks gestation and if you do not have high blood pressure ?Tylenol ?Vicks Vaporub ?Zinc lozenges ?Zyrtec  ? ?Constipation: ?Colace ?Ducolax suppositories ?Fleet enema ?Glycerin suppositories ?Metamucil ?Milk of magnesia ?Miralax ?Senokot ?Smooth move tea ? ?Diarrhea: ?Kaopectate ?Imodium A-D ? ?*NO pepto Bismol ? ?Hemorrhoids: ?Anusol ?Anusol HC ?Preparation H ?Tucks ? ?Indigestion: ?Tums ?Maalox ?Mylanta ?Zantac  ?Pepcid ? ?Insomnia: ?Benadryl (alcohol free) 25mg  every 6 hours as needed ?Tylenol PM ?Unisom, no Gelcaps ? ?Leg Cramps: ?Tums ?MagGel ? ?Nausea/Vomiting:  ?Bonine ?Dramamine ?Emetrol ?Ginger extract ?Sea bands ?Meclizine  ?Nausea medication to take during pregnancy:  ?Unisom (doxylamine succinate 25 mg tablets) Take one tablet daily at bedtime. If symptoms are not adequately controlled, the dose can be increased to a maximum recommended dose of two tablets daily (1/2 tablet in the morning, 1/2 tablet mid-afternoon and one at bedtime). ?Vitamin B6 100mg  tablets. Take one tablet twice a day (up to 200 mg per day). ? ?Skin Rashes: ?Aveeno products ?Benadryl cream or 25mg  every 6 hours as needed ?Calamine Lotion ?1% cortisone cream ? ?Yeast infection: ?Gyne-lotrimin 7 ?Monistat 7 ? ? ?**If  taking multiple medications, please check labels to avoid duplicating the same active ingredients ?**take medication as directed on the label ?** Do not exceed 4000 mg of tylenol in 24 hours ?**Do not take medications that contain aspirin or ibuprofen ? ? ? ?

## 2022-03-06 ENCOUNTER — Encounter: Payer: Self-pay | Admitting: Obstetrics and Gynecology

## 2022-03-06 ENCOUNTER — Telehealth: Payer: Self-pay | Admitting: Family Medicine

## 2022-03-06 DIAGNOSIS — Z3480 Encounter for supervision of other normal pregnancy, unspecified trimester: Secondary | ICD-10-CM | POA: Insufficient documentation

## 2022-03-06 DIAGNOSIS — Z3490 Encounter for supervision of normal pregnancy, unspecified, unspecified trimester: Secondary | ICD-10-CM | POA: Insufficient documentation

## 2022-03-06 LAB — GC/CHLAMYDIA PROBE AMP (~~LOC~~) NOT AT ARMC
Chlamydia: NEGATIVE
Comment: NEGATIVE
Comment: NORMAL
Neisseria Gonorrhea: NEGATIVE

## 2022-03-06 LAB — RUBELLA SCREEN: Rubella: 1 index (ref >0.99)

## 2022-03-06 LAB — RPR: RPR Ser Ql: NONREACTIVE

## 2022-03-06 NOTE — Telephone Encounter (Signed)
Called patient to get New OB scheduled, there was no answer to the phone call so a voicemail was left with the call back number for the office. ?

## 2022-03-07 LAB — CULTURE, BETA STREP (GROUP B ONLY)

## 2022-03-13 ENCOUNTER — Encounter: Payer: Self-pay | Admitting: Family Medicine

## 2022-03-13 ENCOUNTER — Telehealth: Payer: Self-pay | Admitting: Family Medicine

## 2022-03-13 NOTE — Telephone Encounter (Signed)
Called patient to schedule new ob appointment, there was no answer to the call so a voicemail was left with the call back number for the office and a letter was mailed.  ?

## 2022-03-21 ENCOUNTER — Inpatient Hospital Stay (HOSPITAL_COMMUNITY): Payer: Medicaid Other | Admitting: Anesthesiology

## 2022-03-21 ENCOUNTER — Inpatient Hospital Stay (HOSPITAL_COMMUNITY)
Admission: AD | Admit: 2022-03-21 | Discharge: 2022-03-23 | DRG: 807 | Disposition: A | Discharge status: Home / Self Care | Payer: Medicaid Other | Attending: Family Medicine | Admitting: Family Medicine

## 2022-03-21 ENCOUNTER — Encounter (HOSPITAL_COMMUNITY): Payer: Self-pay | Admitting: Obstetrics and Gynecology

## 2022-03-21 ENCOUNTER — Other Ambulatory Visit: Payer: Self-pay

## 2022-03-21 DIAGNOSIS — O0930 Supervision of pregnancy with insufficient antenatal care, unspecified trimester: Secondary | ICD-10-CM

## 2022-03-21 DIAGNOSIS — Z23 Encounter for immunization: Secondary | ICD-10-CM | POA: Diagnosis not present

## 2022-03-21 DIAGNOSIS — Z3A40 40 weeks gestation of pregnancy: Secondary | ICD-10-CM | POA: Diagnosis not present

## 2022-03-21 DIAGNOSIS — O26893 Other specified pregnancy related conditions, third trimester: Secondary | ICD-10-CM | POA: Diagnosis present

## 2022-03-21 LAB — CBC
HCT: 33.4 % — ABNORMAL LOW (ref 36.0–46.0)
Hemoglobin: 10.7 g/dL — ABNORMAL LOW (ref 12.0–15.0)
MCH: 26.4 pg (ref 26.0–34.0)
MCHC: 32 g/dL (ref 30.0–36.0)
MCV: 82.5 fL (ref 80.0–100.0)
Platelets: 242 10*3/uL (ref 150–400)
RBC: 4.05 MIL/uL (ref 3.87–5.11)
RDW: 14.2 % (ref 11.5–15.5)
WBC: 10.1 10*3/uL (ref 4.0–10.5)
nRBC: 0 % (ref 0.0–0.2)

## 2022-03-21 LAB — TYPE AND SCREEN
ABO/RH(D): O POS
Antibody Screen: NEGATIVE

## 2022-03-21 LAB — HEMOGLOBIN A1C
Hgb A1c MFr Bld: 5.4 % (ref 4.8–5.6)
Mean Plasma Glucose: 108.28 mg/dL

## 2022-03-21 MED ORDER — LIDOCAINE HCL (PF) 1 % IJ SOLN: 30.0000 mL | INTRAMUSCULAR | Status: DC | PRN

## 2022-03-21 MED ORDER — EPHEDRINE 5 MG/ML INJ: 10.0000 mg | INTRAVENOUS | Status: DC | PRN

## 2022-03-21 MED ORDER — MISOPROSTOL 200 MCG PO TABS: 800.0000 ug | ORAL_TABLET | Freq: Once | ORAL | Status: AC

## 2022-03-21 MED ORDER — WITCH HAZEL-GLYCERIN EX PADS: 1.0000 "application " | MEDICATED_PAD | CUTANEOUS | Status: DC | PRN

## 2022-03-21 MED ORDER — ONDANSETRON HCL 4 MG/2ML IJ SOLN: 4.0000 mg | Freq: Four times a day (QID) | INTRAMUSCULAR | Status: DC | PRN

## 2022-03-21 MED ORDER — TETANUS-DIPHTH-ACELL PERTUSSIS 5-2.5-18.5 LF-MCG/0.5 IM SUSY
0.5000 mL | PREFILLED_SYRINGE | Freq: Once | INTRAMUSCULAR | Status: AC
  Administered 2022-03-22: 0.5 mL via INTRAMUSCULAR
  Filled 2022-03-21: qty 0.5

## 2022-03-21 MED ORDER — OXYCODONE-ACETAMINOPHEN 5-325 MG PO TABS: 2.0000 | ORAL_TABLET | ORAL | Status: DC | PRN

## 2022-03-21 MED ORDER — MEASLES, MUMPS & RUBELLA VAC IJ SOLR: 0.5000 mL | Freq: Once | INTRAMUSCULAR | Status: DC

## 2022-03-21 MED ORDER — IBUPROFEN 600 MG PO TABS
600.0000 mg | ORAL_TABLET | Freq: Four times a day (QID) | ORAL | Status: DC
  Administered 2022-03-21 – 2022-03-23 (×5): 600 mg via ORAL
  Filled 2022-03-21 (×5): qty 1

## 2022-03-21 MED ORDER — LACTATED RINGERS IV SOLN
500.0000 mL | Freq: Once | INTRAVENOUS | Status: AC
  Administered 2022-03-21: 500 mL via INTRAVENOUS

## 2022-03-21 MED ORDER — LACTATED RINGERS IV SOLN: INTRAVENOUS | Status: DC

## 2022-03-21 MED ORDER — ONDANSETRON HCL 4 MG/2ML IJ SOLN: 4.0000 mg | INTRAMUSCULAR | Status: DC | PRN

## 2022-03-21 MED ORDER — METHYLERGONOVINE MALEATE 0.2 MG/ML IJ SOLN: 0.2000 mg | INTRAMUSCULAR | Status: DC | PRN

## 2022-03-21 MED ORDER — FERROUS SULFATE 325 (65 FE) MG PO TABS
325.0000 mg | ORAL_TABLET | ORAL | Status: DC
  Administered 2022-03-21: 325 mg via ORAL
  Filled 2022-03-21 (×2): qty 1

## 2022-03-21 MED ORDER — LIDOCAINE HCL (PF) 1 % IJ SOLN
INTRAMUSCULAR | Status: DC | PRN
  Administered 2022-03-21: 10 mL via EPIDURAL
  Administered 2022-03-21: 2 mL via EPIDURAL

## 2022-03-21 MED ORDER — BENZOCAINE-MENTHOL 20-0.5 % EX AERO: 1.0000 "application " | INHALATION_SPRAY | CUTANEOUS | Status: DC | PRN

## 2022-03-21 MED ORDER — BISACODYL 10 MG RE SUPP
10.0000 mg | Freq: Every day | RECTAL | Status: DC | PRN
  Filled 2022-03-21: qty 1

## 2022-03-21 MED ORDER — DIPHENHYDRAMINE HCL 25 MG PO CAPS: 25.0000 mg | ORAL_CAPSULE | Freq: Four times a day (QID) | ORAL | Status: DC | PRN

## 2022-03-21 MED ORDER — PHENYLEPHRINE 80 MCG/ML (10ML) SYRINGE FOR IV PUSH (FOR BLOOD PRESSURE SUPPORT): 80.0000 ug | PREFILLED_SYRINGE | INTRAVENOUS | Status: DC | PRN

## 2022-03-21 MED ORDER — OXYTOCIN-SODIUM CHLORIDE 30-0.9 UT/500ML-% IV SOLN
2.5000 [IU]/h | INTRAVENOUS | Status: DC
  Filled 2022-03-21: qty 500

## 2022-03-21 MED ORDER — DOCUSATE SODIUM 100 MG PO CAPS
100.0000 mg | ORAL_CAPSULE | Freq: Two times a day (BID) | ORAL | Status: DC
  Administered 2022-03-21 – 2022-03-22 (×3): 100 mg via ORAL
  Filled 2022-03-21 (×4): qty 1

## 2022-03-21 MED ORDER — ACETAMINOPHEN 325 MG PO TABS: 650.0000 mg | ORAL_TABLET | ORAL | Status: DC | PRN

## 2022-03-21 MED ORDER — OXYTOCIN BOLUS FROM INFUSION
333.0000 mL | Freq: Once | INTRAVENOUS | Status: AC
  Administered 2022-03-21: 333 mL via INTRAVENOUS

## 2022-03-21 MED ORDER — FLEET ENEMA 7-19 GM/118ML RE ENEM: 1.0000 | ENEMA | Freq: Every day | RECTAL | Status: DC | PRN

## 2022-03-21 MED ORDER — OXYCODONE-ACETAMINOPHEN 5-325 MG PO TABS: 1.0000 | ORAL_TABLET | ORAL | Status: DC | PRN

## 2022-03-21 MED ORDER — FENTANYL CITRATE (PF) 100 MCG/2ML IJ SOLN: 50.0000 ug | INTRAMUSCULAR | Status: DC | PRN

## 2022-03-21 MED ORDER — DIBUCAINE (PERIANAL) 1 % EX OINT: 1.0000 "application " | TOPICAL_OINTMENT | CUTANEOUS | Status: DC | PRN

## 2022-03-21 MED ORDER — PRENATAL MULTIVITAMIN CH
1.0000 | ORAL_TABLET | Freq: Every day | ORAL | Status: DC
  Administered 2022-03-21 – 2022-03-22 (×2): 1 via ORAL
  Filled 2022-03-21 (×3): qty 1

## 2022-03-21 MED ORDER — METHYLERGONOVINE MALEATE 0.2 MG PO TABS: 0.2000 mg | ORAL_TABLET | ORAL | Status: DC | PRN

## 2022-03-21 MED ORDER — MISOPROSTOL 200 MCG PO TABS
ORAL_TABLET | ORAL | Status: AC
  Administered 2022-03-21: 800 ug via RECTAL
  Filled 2022-03-21: qty 4

## 2022-03-21 MED ORDER — COCONUT OIL OIL: 1.0000 "application " | TOPICAL_OIL | Status: DC | PRN

## 2022-03-21 MED ORDER — ONDANSETRON HCL 4 MG PO TABS: 4.0000 mg | ORAL_TABLET | ORAL | Status: DC | PRN

## 2022-03-21 MED ORDER — DIPHENHYDRAMINE HCL 50 MG/ML IJ SOLN: 12.5000 mg | INTRAMUSCULAR | Status: DC | PRN

## 2022-03-21 MED ORDER — SIMETHICONE 80 MG PO CHEW: 80.0000 mg | CHEWABLE_TABLET | ORAL | Status: DC | PRN

## 2022-03-21 MED ORDER — FENTANYL-BUPIVACAINE-NACL 0.5-0.125-0.9 MG/250ML-% EP SOLN
EPIDURAL | Status: DC | PRN
  Administered 2022-03-21: 12 mL/h via EPIDURAL

## 2022-03-21 MED ORDER — FENTANYL-BUPIVACAINE-NACL 0.5-0.125-0.9 MG/250ML-% EP SOLN
12.0000 mL/h | EPIDURAL | Status: DC | PRN
  Filled 2022-03-21: qty 250

## 2022-03-21 MED ORDER — LACTATED RINGERS IV SOLN: 500.0000 mL | INTRAVENOUS | Status: DC | PRN

## 2022-03-21 MED ORDER — MEDROXYPROGESTERONE ACETATE 150 MG/ML IM SUSP
150.0000 mg | INTRAMUSCULAR | Status: DC | PRN
Start: 2022-03-21 — End: 2022-03-23

## 2022-03-21 MED ORDER — SOD CITRATE-CITRIC ACID 500-334 MG/5ML PO SOLN: 30.0000 mL | ORAL | Status: DC | PRN

## 2022-03-21 NOTE — Anesthesia Preprocedure Evaluation (Addendum)
Anesthesia Evaluation  ?Patient identified by MRN, date of birth, ID band ?Patient awake ? ? ? ?Reviewed: ?Allergy & Precautions, Patient's Chart, lab work & pertinent test results ? ?Airway ?Mallampati: II ? ?TM Distance: >3 FB ?Neck ROM: Full ? ? ? Dental ?no notable dental hx. ? ?  ?Pulmonary ?neg pulmonary ROS,  ?  ?Pulmonary exam normal ?breath sounds clear to auscultation ? ? ? ? ? ? Cardiovascular ?negative cardio ROS ?Normal cardiovascular exam ?Rhythm:Regular Rate:Normal ? ? ?  ?Neuro/Psych ?negative neurological ROS ? negative psych ROS  ? GI/Hepatic ?negative GI ROS, Neg liver ROS,   ?Endo/Other  ?negative endocrine ROS ? Renal/GU ?negative Renal ROS  ?negative genitourinary ?  ?Musculoskeletal ?negative musculoskeletal ROS ?(+)  ? Abdominal ?  ?Peds ?negative pediatric ROS ?(+)  Hematology ? ?(+) Blood dyscrasia, anemia , Hb 10.7, plt 242   ?Anesthesia Other Findings ? ? Reproductive/Obstetrics ?(+) Pregnancy ? ?  ? ? ? ? ? ? ? ? ? ? ? ? ? ?  ?  ? ? ? ? ? ? ? ? ?Anesthesia Physical ?Anesthesia Plan ? ?ASA: 2 ? ?Anesthesia Plan: Epidural  ? ?Post-op Pain Management:   ? ?Induction:  ? ?PONV Risk Score and Plan: 2 ? ?Airway Management Planned: Natural Airway ? ?Additional Equipment: None ? ?Intra-op Plan:  ? ?Post-operative Plan:  ? ?Informed Consent: I have reviewed the patients History and Physical, chart, labs and discussed the procedure including the risks, benefits and alternatives for the proposed anesthesia with the patient or authorized representative who has indicated his/her understanding and acceptance.  ? ? ? ? ? ?Plan Discussed with:  ? ?Anesthesia Plan Comments:   ? ? ? ? ? ? ?Anesthesia Quick Evaluation ? ?

## 2022-03-21 NOTE — MAU Note (Addendum)
...  Alexandria [name] is a 26 y.o. at [redacted]w[redacted]d here in MAU reporting: CTX since 1100 this morning that are currently every 4-7 minutes. Denies VB or LOF. +FM. GBS-.  ? ?No Prenatal care. Just returned from [place] one month ago. Was seen in MAU earlier this month and had labs drawn. ? ?Pain score:  ?8/10 lower abdomen ?8/10 lower back ? ?FHT: 147 ?Lab orders placed from triage: MAU Labor ? ?

## 2022-03-21 NOTE — Anesthesia Procedure Notes (Signed)
Epidural ?Patient location during procedure: OB ?Start time: 03/21/2022 6:08 PM ?End time: 03/21/2022 6:15 PM ? ?Staffing ?Anesthesiologist: Lannie Fields, DO ?Performed: anesthesiologist  ? ?Preanesthetic Checklist ?Completed: patient identified, IV checked, risks and benefits discussed, monitors and equipment checked, pre-op evaluation and timeout performed ? ?Epidural ?Patient position: sitting ?Prep: DuraPrep and site prepped and draped ?Patient monitoring: continuous pulse ox, blood pressure, heart rate and cardiac monitor ?Approach: midline ?Location: L3-L4 ?Injection technique: LOR air ? ?Needle:  ?Needle type: Tuohy  ?Needle gauge: 17 G ?Needle length: 9 cm ?Needle insertion depth: 4.5 cm ?Catheter type: closed end flexible ?Catheter size: 19 Gauge ?Catheter at skin depth: 10 cm ?Test dose: negative ? ?Assessment ?Sensory level: T8 ?Events: blood not aspirated, injection not painful, no injection resistance, no paresthesia and negative IV test ? ?Additional Notes ?Patient identified. Risks/Benefits/Options discussed with patient including but not limited to bleeding, infection, nerve damage, paralysis, failed block, incomplete pain control, headache, blood pressure changes, nausea, vomiting, reactions to medication both or allergic, itching and postpartum back pain. Confirmed with bedside nurse the patient's most recent platelet count. Confirmed with patient that they are not currently taking any anticoagulation, have any bleeding history or any family history of bleeding disorders. Patient expressed understanding and wished to proceed. All questions were answered. Sterile technique was used throughout the entire procedure. Please see nursing notes for vital signs. Test dose was given through epidural catheter and negative prior to continuing to dose epidural or start infusion. Warning signs of high block given to the patient including shortness of breath, tingling/numbness in hands, complete motor  block, or any concerning symptoms with instructions to call for help. Patient was given instructions on fall risk and not to get out of bed. All questions and concerns addressed with instructions to call with any issues or inadequate analgesia.  Reason for block:procedure for pain ? ? ? ?

## 2022-03-21 NOTE — H&P (Signed)
LABOR AND DELIVERY ADMISSION HISTORY AND PHYSICAL NOTE ? ?Alexandria [name] is a 26 y.o. female [phone] with IUP at [redacted]w[redacted]d by LMP presenting for spontaneous labor.  ? ?She reports positive fetal movement. She denies leakage of fluid, vaginal bleeding. Endorses regular contractions.  ? ?Patient reports she did not receive any prenatal care in [place]. Had most of her prenatal labs at MAU visit on 03/05/2022, with exception of A1c.  ? ?She plans on breast and bottle feeding. Her contraception plan is: none. ? ?Prenatal History/Complications: ?Scant PNC in [place] ?No [place] this pregnancy ? ?Pregnancy complications:  ?- none known ?- two prior NSVD ? ?Past Medical History: ?Past Medical History:  ?Diagnosis Date  ? Medical history non-contributory   ? ? ?Past Surgical History: ?Past Surgical History:  ?Procedure Laterality Date  ? NO PAST SURGERIES    ? ? ?Obstetrical History: ?OB History   ? ? Gravida  ?3  ? Para  ?2  ? Term  ?2  ? Preterm  ?0  ? AB  ?0  ? Living  ?2  ?  ? ? SAB  ?0  ? IAB  ?0  ? Ectopic  ?0  ? Multiple  ?0  ? Live Births  ?2  ?   ?  ?  ? ? ?Social History: ?Social History  ? ?Socioeconomic History  ? Marital status: Single  ?  Spouse name: Not on file  ? Number of children: 2  ? Years of education: Not on file  ? Highest education level: 12th grade  ?Occupational History  ? Not on file  ?Tobacco Use  ? Smoking status: Never  ? Smokeless tobacco: Never  ?Substance and Sexual Activity  ? Alcohol use: No  ? Drug use: No  ? Sexual activity: Yes  ?  Birth control/protection: None  ?Other Topics Concern  ? Not on file  ?Social History Narrative  ? Not on file  ? ?Social Determinants of Health  ? ?Financial Resource Strain: Not on file  ?Food Insecurity: Not on file  ?Transportation Needs: Not on file  ?Physical Activity: Not on file  ?Stress: Not on file  ?Social Connections: Not on file  ? ? ?Family History: ?Family History  ?Problem Relation Age of Onset  ? Diabetes Maternal Grandmother    ? ? ?Allergies: ?No Known Allergies ? ?Medications Prior to Admission  ?Medication Sig Dispense Refill Last Dose  ? acetaminophen (TYLENOL) 325 MG tablet Take 650 mg by mouth every 6 (six) hours as needed.     ? Prenatal Vit-Fe Fumarate-FA (PRENATAL MULTIVITAMIN) TABS tablet Take 1 tablet by mouth daily at 12 noon.     ? ? ? ?Review of Systems  ?All systems reviewed and negative except as stated in HPI ? ?Physical Exam ?Blood pressure 115/63, pulse 84, temperature 98.1 ?F (36.7 ?C), temperature source Oral, resp. rate 15, height 5\' 2"  (1.575 m), weight 73.6 kg, SpO2 100 %, unknown if currently breastfeeding. ?General appearance: alert, oriented, NAD ?Lungs: normal respiratory effort ?Heart: regular rate ?Abdomen: soft, non-tender; gravid, leopolds 2500g ?Extremities: No calf swelling or tenderness ?Presentation: cephalic by ultrasound ? ?Fetal monitoringBaseline: 145 bpm, Variability: Good {> 6 bpm), Accelerations: Reactive, and Decelerations: Absent ?Uterine activity: regular q2-3 min ? ?Dilation: 5 ?Effacement (%): 80 ?Station: -1 ?Exam by:: K.Wilson,RN ? ?Prenatal labs: ?ABO, Rh: --/--/O POS (04/20 1714) ?Antibody: PENDING (04/20 1714) ?Rubella: 1.00 (04/04 1906) ?RPR: NON REACTIVE (04/04 1906)  ?HBsAg: NON REACTIVE (04/04 1906)  ?HIV: Non Reactive (04/04 1906)  ?  GC/Chlamydia:  ?Neisseria Gonorrhea  ?Date Value Ref Range Status  ?03/05/2022 Negative  Final  ? ?Chlamydia  ?Date Value Ref Range Status  ?03/05/2022 Negative  Final  ? ?GBS:    ?2-hr GTT: not done ?Genetic screening:  not done ?Anatomy US: not done ? ?Prenatal Transfer Tool  ?Maternal Diabetes: unknown ?Genetic Screening: not done ?Maternal Ultrasounds/Referrals: not done ?Fetal Ultrasounds or other Referrals:  None ?Maternal Substance Abuse:  No ?Significant Maternal Medications:  None ?Significant Maternal Lab Results: Group B Strep negative ? ?Results for orders placed or performed during the hospital encounter of 03/21/22 (from the past 24  hour(s))  ?CBC  ? Collection Time: 03/21/22  4:55 PM  ?Result Value Ref Range  ? WBC 10.1 4.0 - 10.5 K/uL  ? RBC 4.05 3.87 - 5.11 MIL/uL  ? Hemoglobin 10.7 (L) 12.0 - 15.0 g/dL  ? HCT 33.4 (L) 36.0 - 46.0 %  ? MCV 82.5 80.0 - 100.0 fL  ? MCH 26.4 26.0 - 34.0 pg  ? MCHC 32.0 30.0 - 36.0 g/dL  ? RDW 14.2 11.5 - 15.5 %  ? Platelets 242 150 - 400 K/uL  ? nRBC 0.0 0.0 - 0.2 %  ?Type and screen [facility]  ? Collection Time: 03/21/22  5:14 PM  ?Result Value Ref Range  ? ABO/RH(D) O POS   ? Antibody Screen PENDING   ? Sample Expiration    ?  03/24/2022,2359 ?Performed at [facility], [address], [place], [place] [address] ?  ? ? ?Patient Active Problem List  ? Diagnosis Date Noted  ? Indication for care in labor and delivery, antepartum 03/21/2022  ? Supervision of other normal pregnancy, antepartum 03/06/2022  ? ? ?Assessment: ?Alexandria [name] is a 26 y.o. G3P2002 at [redacted]w[redacted]d here for spontaneous labor.  ? ?#Labor: Arrived 5 cm, will get epidural and then proceed with AROM ?#Pain: epidural ?#FWB: Cat I ?#GBS/ID: neg ?#MOF: breast and bottle ?#MOC: none ?#Circ: TBD ? ? ?Venora Maples ?03/21/2022, 5:51 PM ? ?

## 2022-03-21 NOTE — Lactation Note (Signed)
This note was copied from a baby's chart. ?Lactation Consultation Note ?Mom chooses to formula feed. ? ?Patient Name: Alexandria [name] ?Today's Date: 03/21/2022 ?  ?Age:24 hours ? ?Maternal Data ?  ? ?Feeding ?  ? ?LATCH Score ?  ? ?  ? ?  ? ?  ? ?  ? ?  ? ? ?Lactation Tools Discussed/Used ?  ? ?Interventions ?  ? ?Discharge ?  ? ?Consult Status ?Consult Status: Complete ? ? ? ?Charyl Dancer ?03/21/2022, 7:45 PM ? ? ? ?

## 2022-03-21 NOTE — Discharge Summary (Signed)
? ?  Postpartum Discharge Summary ? ? ?   ?Patient Name: Alexandria [name] ?DOB: 1996-01-23 ?MRN: [id number] ? ?Date of admission: 03/21/2022 ?Delivery date:03/21/2022  ?Delivering provider: Christin Fudge  ?Date of discharge: 03/22/2022 ? ?Admitting diagnosis: Indication for care in labor and delivery, antepartum [O75.9] ?Intrauterine pregnancy: [redacted]w[redacted]d    ?Secondary diagnosis:  Active Problems: ?  Indication for care in labor and delivery, antepartum ? ?Additional problems: No PNC    ?Discharge diagnosis: Term Pregnancy Delivered                                              ?Post partum procedures:none ?Augmentation: AROM ?Complications: None ? ?Hospital course: Onset of Labor With Vaginal Delivery      ?26y.o. yo G3P2002 at 446w0das admitted in Active Labor on 03/21/2022. Patient had an uncomplicated labor course as follows:  ?Membrane Rupture Time/Date: 6:50 PM ,03/21/2022   ?Delivery Method:Vaginal, Spontaneous  ?Episiotomy: None  ?Lacerations:  None  ?Patient had an uncomplicated postpartum course.  She is ambulating, tolerating a regular diet, passing flatus, and urinating well. Patient strongly desires discharge, or rooming in if baby isn't discharged on 03/22/22. ? ?Newborn Data: ?Birth date:03/21/2022  ?Birth time:7:12 PM  ?Gender:Female  ?Living status:Living  ?Apgars:9 ,9  ?Weight:3340 g  ? ?Magnesium Sulfate received: No ?BMZ received: No ?Rhophylac:N/A ?MMR:N/A ?T-DaP:no ?Flu: No ?Transfusion:No ? ?Physical exam  ?Vitals:  ? 03/21/22 2115 03/21/22 2210 03/22/22 0205 03/22/22 0601  ?BP: 110/71 112/67 101/62 102/61  ?Pulse: 88 87 74 80  ?Resp: _0 ?Temp: 97.9 ?F (36.6 ?C) 98.8 ?F (37.1 ?C) 98.1 ?F (36.7 ?C) 99 ?F (37.2 ?C)  ?TempSrc: Oral Oral Oral Oral  ?SpO2: 100% 100% 100% 99%  ?Weight:      ?Height:      ? ?General: alert, cooperative, and no distress ?Lochia: appropriate ?Uterine Fundus: firm ?Incision: N/A ?DVT Evaluation: No evidence of DVT seen on physical  exam. ?Negative Homan's sign. ?No cords or calf tenderness. ?Labs: ?Lab Results  ?Component Value Date  ? WBC 10.6 (H) 03/22/2022  ? HGB 8.7 (L) 03/22/2022  ? HCT 26.5 (L) 03/22/2022  ? MCV 82.0 03/22/2022  ? PLT 187 03/22/2022  ? ?   ? View : No data to display.  ?  ?  ?  ? ?Edinburgh Score: ? ?  08/27/2018  ? 11:55 AM  ?EdFlavia Shipperostnatal Depression Scale Screening Tool  ?I have been able to laugh and see the funny side of things. 0  ?I have looked forward with enjoyment to things. 0  ?I have blamed myself unnecessarily when things went wrong. 0  ?I have been anxious or worried for no good reason. 0  ?I have felt scared or panicky for no good reason. 0  ?Things have been getting on top of me. 0  ?I have been so unhappy that I have had difficulty sleeping. 0  ?I have felt sad or miserable. 0  ?I have been so unhappy that I have been crying. 0  ?The thought of harming myself has occurred to me. 0  ?Edinburgh Postnatal Depression Scale Total 0  ? ? ? ?After visit meds:  ?Allergies as of 03/22/2022   ?No Known Allergies ?  ? ?  ?Medication List  ?  ? ?TAKE these medications   ? ?acetaminophen 325 MG tablet ?Commonly  known as: Tylenol ?Take 2 tablets (650 mg total) by mouth every 4 (four) hours as needed (for pain scale < 4). ?What changed:  ?when to take this ?reasons to take this ?  ?ibuprofen 600 MG tablet ?Commonly known as: ADVIL ?Take 1 tablet (600 mg total) by mouth every 6 (six) hours. ?  ?prenatal multivitamin Tabs tablet ?Take 1 tablet by mouth daily at 12 noon. ?  ? ?  ? ? ? ?Discharge home in stable condition ?Infant Feeding: Bottle and Breast ?Infant Disposition:rooming in ?Discharge instruction: per After Visit Summary and Postpartum booklet. ?Activity: Advance as tolerated. Pelvic rest for 6 weeks.  ?Diet: routine diet ?Future Appointments: ?Future Appointments  ?Date Time Provider [place]  ?04/22/2022  3:35 PM Tresea Mall, CNM [facility]  ? ?Follow up Visit: ? ? ?Please schedule this  patient for a In person postpartum visit in 4 weeks with the following provider: Any provider. ?Additional Postpartum F/U:  ?Low risk pregnancy complicated by: no PNC ?Delivery mode:  Vaginal, Spontaneous  ?Anticipated Birth Control:   declines ? ? ?03/22/2022 ?Christin Fudge, CNM ? ? ? ?

## 2022-03-21 NOTE — Progress Notes (Signed)
Patient Vitals for the past 4 hrs: ? BP Temp Temp src Pulse Resp SpO2 Height Weight  ?03/21/22 1835 105/67 -- -- 88 -- 100 % -- --  ?03/21/22 1831 (!) 108/56 -- -- 82 16 -- -- --  ?03/21/22 1826 (!) 103/53 -- -- 86 16 -- -- --  ?03/21/22 1821 (!) 107/54 -- -- 83 18 -- -- --  ?03/21/22 1631 115/63 -- -- 84 -- -- -- --  ?03/21/22 1617 101/64 98.1 ?F (36.7 ?C) Oral 86 15 100 % 5\' 2"  (1.575 m) 73.6 kg  ? ?Comfortable w/epidural. Cx 9/100/0. AROM w/clear fluid.  FHR Cat 1.  Expect SVD soon ?

## 2022-03-22 ENCOUNTER — Other Ambulatory Visit (HOSPITAL_COMMUNITY): Payer: Self-pay

## 2022-03-22 LAB — CBC
HCT: 26.5 % — ABNORMAL LOW (ref 36.0–46.0)
Hemoglobin: 8.7 g/dL — ABNORMAL LOW (ref 12.0–15.0)
MCH: 26.9 pg (ref 26.0–34.0)
MCHC: 32.8 g/dL (ref 30.0–36.0)
MCV: 82 fL (ref 80.0–100.0)
Platelets: 187 10*3/uL (ref 150–400)
RBC: 3.23 MIL/uL — ABNORMAL LOW (ref 3.87–5.11)
RDW: 14 % (ref 11.5–15.5)
WBC: 10.6 10*3/uL — ABNORMAL HIGH (ref 4.0–10.5)
nRBC: 0 % (ref 0.0–0.2)

## 2022-03-22 LAB — RPR: RPR Ser Ql: NONREACTIVE

## 2022-03-22 MED ORDER — ACETAMINOPHEN 325 MG PO TABS
650.0000 mg | ORAL_TABLET | ORAL | 0 refills | Status: DC | PRN
  Filled 2022-03-22: qty 30, 3d supply, fill #0

## 2022-03-22 MED ORDER — IBUPROFEN 600 MG PO TABS
600.0000 mg | ORAL_TABLET | Freq: Four times a day (QID) | ORAL | 0 refills | Status: DC
  Filled 2022-03-22: qty 30, 8d supply, fill #0

## 2022-03-22 NOTE — Clinical Social Work Maternal (Signed)
?CLINICAL SOCIAL WORK MATERNAL/CHILD NOTE ? ?Patient Details  ?Name: Desera Graffeo Solorzano ?MRN: [id number] ?Date of Birth: September 19, 1996 ? ?Date:  03/22/2022 ? ?Clinical Social Worker Initiating Note:  Kathrin Greathouse, LCSW Date/Time: Initiated:  03/22/22/1045    ? ?Child's Name:  Barbie Haggis  ? ?Biological Parents:  Mother, Father (MOB: Kaho Selle 1996-09-04, FOB: Humphrey Rolls 08-03-1985)  ? ?Need for Interpreter:  None  ? ?Reason for Referral:  Late or No Prenatal Care    ? ?Address:  [address] ?[place] [address]  ?  ?Phone number:  [phone] (home)    ? ?Additional phone number:  ? ?Household Members/Support Persons (HM/SP):   Household Member/Support Person 1, Household Member/Support Person 2, Household Member/Support Person 3 ? ? ?HM/SP Name Relationship DOB or Age  ?HM/SP -1 Humphrey Rolls Spouse 08-03-1985  ?HM/SP -2 Karis Juba Daughter 10-08-2017  ?HM/SP -3 Ferne Coe Daughter 08-26-2018  ?HM/SP -4        ?HM/SP -5        ?HM/SP -6        ?HM/SP -7        ?HM/SP -8        ? ? ?Natural Supports (not living in the home):     ? ?Professional Supports: None  ? ?Employment: Unemployed  ? ?Type of Work:    ? ?Education:  9 to 11 years  ? ?Homebound arranged: No ? ?Financial Resources:     ? ?Other Resources:     ? ?Cultural/Religious Considerations Which May Impact Care:   ? ?Strengths:  Ability to meet basic needs  , Home prepared for child  , Pediatrician chosen  ? ?Psychotropic Medications:        ? ?Pediatrician:    [place] area ? ?Pediatrician List:  ? ?[facility]  ?[place]    ?[facility]    ?[facility]    ?[facility]    ?[facility]    ? ? ?Pediatrician Fax Number:   ? ?Risk Factors/Current Problems:  None  ? ?Cognitive State:  Able to Concentrate  , Alert  , Linear Thinking  , Insightful    ? ?Mood/Affect:  Interested  , Calm    ? ?CSW Assessment:CSW received consult for hx No Prenatal Care. CSW  met with MOB to offer support and complete assessment.   ? ?CSW met with MOB at bedside and introduced CSW role. CSW observed MOB in bed and the infant asleep in the bassinet. MOB presented calm and was receptive to [place] visit. MOB confirmed that the demographic information on file is correct. MOB reported that she lives with her husband and two children. MOB identified FOB as her support.  ? ?CSW inquired about MOB prenatal care. MOB reported that she received prenatal care while in she was staying in [place]. MOB reported that she moved to [place] from [place] about a month ago. MOB reported she did not bring the prenatal care records with her. CSW informed MOB about the hospital drug screen policy. CSW made MOB aware that CSW will monitor the infant's UDS/CDS and make a report to CPS, if warranted. MOB denied substance use during the pregnancy. CSW inquired if MOB has CPS history. MOB reported no CPS history.  ? ?CSW inquired if MOB has a mental health history. MOB reported no history of mental health. CSW discussed PPD. MOB denied history of PPD.  ?CSW provided education regarding the baby blues period vs. perinatal  mood disorders, discussed treatment and gave resources for mental health follow up if concerns arise.  CSW recommended MOB complete a self-evaluation during the postpartum time period using the New Mom Checklist from Postpartum Progress and encouraged MOB to contact a medical professional if symptoms are noted at any time. CSW assessed MOB for safety. MOB denied thoughts of harm to self and others.  ? ?CSW assessed if MOB has items for the infant. MOB reported that she has a car seat, bassinet and some diapers and wipes. CSW educated MOB about Manufacturing systems engineer. MOB gave CSW permission to make a referral to [employer]. CSW provided review of Sudden Infant Death Syndrome (SIDS) precautions. MOB reported understanding. MOB reported that she plans to apply for WIC/FS. CSW assisted MOB with reaching out  to [facility] for appointment. CSW assessed MOB for additional needs. MOB reported no further need.   ? ?CSW identifies no further need for intervention and no barriers to discharge at this time. ? ? ?CSW Plan/Description:  Sudden Infant Death Syndrome (SIDS) Education, CSW Will Continue to Monitor Umbilical Cord Tissue Drug Screen Results and Make Report if Warranted, Other Information/Referral to [place], Perinatal Mood and Anxiety Disorder (PMADs) Education, No Further Intervention Required/No Barriers to Discharge  ? ? ?Lia Hopping, LCSW ?03/22/2022, 2:07 PM ? ?

## 2022-03-22 NOTE — Anesthesia Postprocedure Evaluation (Signed)
Anesthesia Post Note ? ?Patient: Alexandria [name] ? ?Procedure(s) Performed: AN AD HOC LABOR EPIDURAL ? ?  ? ?Patient location during evaluation: Mother Baby ?Anesthesia Type: Epidural ?Level of consciousness: awake and alert and oriented ?Pain management: satisfactory to patient ?Vital Signs Assessment: post-procedure vital signs reviewed and stable ?Respiratory status: respiratory function stable ?Cardiovascular status: stable ?Postop Assessment: no headache, no backache, epidural receding, patient able to bend at knees, no signs of nausea or vomiting, adequate PO intake and able to ambulate ?Anesthetic complications: no ? ? ?No notable events documented. ? ?Last Vitals:  ?Vitals:  ? 03/22/22 0205 03/22/22 0601  ?BP: 101/62 102/61  ?Pulse: 74 80  ?Resp: 18 18  ?Temp: 36.7 ?C 37.2 ?C  ?SpO2: 100% 99%  ?  ?Last Pain:  ?Vitals:  ? 03/22/22 0601  ?TempSrc: Oral  ?PainSc: 7   ? ?Pain Goal:   ? ?  ?  ?  ?  ?  ?  ?  ? ?Monty Spicher ? ? ? ? ?

## 2022-03-23 DIAGNOSIS — O0930 Supervision of pregnancy with insufficient antenatal care, unspecified trimester: Secondary | ICD-10-CM

## 2022-03-23 NOTE — Discharge Summary (Signed)
? ?  Postpartum Discharge Summary ? ? ?   ?Patient Name: Alexandria [name] ?DOB: 1996-10-31 ?MRN: [id number] ? ?Date of admission: 03/21/2022 ?Delivery date:03/21/2022  ?Delivering provider: Christin Fudge  ?Date of discharge: 03/23/2022 ? ?Admitting diagnosis: Indication for care in labor and delivery, antepartum [O75.9] ?Intrauterine pregnancy: [redacted]w[redacted]d    ?Secondary diagnosis:  Active Problems: ?  Indication for care in labor and delivery, antepartum ?  No prenatal care in current pregnancy ? ?Additional problems: No PNC    ?Discharge diagnosis: Term Pregnancy Delivered                                              ?Post partum procedures:none ?Augmentation: AROM ?Complications: None ? ?Hospital course: Onset of Labor With Vaginal Delivery      ?26y.o. yo G3P2002 at 423w0das admitted in Active Labor on 03/21/2022. Patient had an uncomplicated labor course as follows:  ?Membrane Rupture Time/Date: 6:50 PM ,03/21/2022   ?Delivery Method:Vaginal, Spontaneous  ?Episiotomy: None  ?Lacerations:  None  ?Patient had an uncomplicated postpartum course.  She is ambulating, tolerating a regular diet, passing flatus, and urinating well. She had a SW consult while an inpatient with no barriers to d/c. Patient strongly desires discharge, or rooming in if baby isn't discharged on 03/22/22, however she ended up deciding to remain a patient overnight and is now discharged on 03/23/2022 ?. ? ?Newborn Data: ?Birth date:03/21/2022  ?Birth time:7:12 PM  ?Gender:Female  ?Living status:Living  ?Apgars:9 ,9  ?Weight:3340 g (7lb 5.8oz) ? ?Magnesium Sulfate received: No ?BMZ received: No ?Rhophylac:N/A ?MMR:N/A ?T-DaP:no ?Flu: No ?Transfusion:No ? ?Physical exam  ?Vitals:  ? 03/22/22 0601 03/22/22 0930 03/22/22 2244 03/23/22 0505  ?BP: 102/61 95/63 (!) 99/58 96/61  ?Pulse: 80 83 75 75  ?Resp: _0 ?Temp: 99 ?F (37.2 ?C) 98.9 ?F (37.2 ?C) 98.2 ?F (36.8 ?C) 98.4 ?F (36.9 ?C)  ?TempSrc: Oral Oral Oral Oral  ?SpO2: 99%  100% 100% 100%  ?Weight:      ?Height:      ? ?General: alert, cooperative, and no distress ?Lochia: appropriate ?Uterine Fundus: firm ?Incision: N/A ?DVT Evaluation: No evidence of DVT seen on physical exam. ?Negative Homan's sign. ?No cords or calf tenderness. ?Labs: ?Lab Results  ?Component Value Date  ? WBC 10.6 (H) 03/22/2022  ? HGB 8.7 (L) 03/22/2022  ? HCT 26.5 (L) 03/22/2022  ? MCV 82.0 03/22/2022  ? PLT 187 03/22/2022  ? ?   ? View : No data to display.  ?  ?  ?  ? ?Edinburgh Score: ? ?  03/22/2022  ?  1:19 PM  ?EdFlavia Shipperostnatal Depression Scale Screening Tool  ?I have been able to laugh and see the funny side of things. 0  ?I have looked forward with enjoyment to things. 0  ?I have blamed myself unnecessarily when things went wrong. 0  ?I have been anxious or worried for no good reason. 0  ?I have felt scared or panicky for no good reason. 0  ?Things have been getting on top of me. 0  ?I have been so unhappy that I have had difficulty sleeping. 0  ?I have felt sad or miserable. 0  ?I have been so unhappy that I have been crying. 0  ?The thought of harming myself has occurred to me. 0  ?Edinburgh Postnatal Depression Scale  Total 0  ? ? ? ?After visit meds:  ?Allergies as of 03/23/2022   ?No Known Allergies ?  ? ?  ?Medication List  ?  ? ?TAKE these medications   ? ?acetaminophen 325 MG tablet ?Commonly known as: Tylenol ?Take 2 tablets (650 mg total) by mouth every 4 (four) hours as needed (for pain scale < 4). ?What changed:  ?when to take this ?reasons to take this ?  ?ibuprofen 600 MG tablet ?Commonly known as: ADVIL ?Take 1 tablet (600 mg total) by mouth every 6 (six) hours. ?  ?prenatal multivitamin Tabs tablet ?Take 1 tablet by mouth daily at 12 noon. ?  ? ?  ? ? ? ?Discharge home in stable condition ?Infant Feeding: Bottle and Breast ?Infant Disposition:rooming in ?Discharge instruction: per After Visit Summary and Postpartum booklet. ?Activity: Advance as tolerated. Pelvic rest for 6 weeks.  ?Diet:  routine diet ?Future Appointments: ?Future Appointments  ?Date Time Provider [place]  ?04/22/2022  3:35 PM Tresea Mall, CNM [facility]  ? ?Follow up Visit: ? ? ?Please schedule this patient for a In person postpartum visit in 4 weeks with the following provider: Any provider. ?Additional Postpartum F/U:  ?Low risk pregnancy complicated by: no PNC ?Delivery mode:  Vaginal, Spontaneous  ?Anticipated Birth Control:  PP Depo given (she desires prior to d/c) ? ? ?03/23/2022 ?Myrtis Ser, CNM ?9:02 AM ? ? ? ? ?

## 2022-03-30 ENCOUNTER — Telehealth (HOSPITAL_COMMUNITY): Payer: Self-pay

## 2022-03-30 NOTE — Telephone Encounter (Signed)
?  No answer. Left message to return nurse call. ? Heron Nay ?03/30/2022,1131 ?

## 2022-04-22 ENCOUNTER — Ambulatory Visit: Payer: Self-pay | Admitting: Advanced Practice Midwife

## 2022-10-07 ENCOUNTER — Ambulatory Visit (INDEPENDENT_AMBULATORY_CARE_PROVIDER_SITE_OTHER): Payer: Self-pay

## 2022-10-07 DIAGNOSIS — Z3201 Encounter for pregnancy test, result positive: Secondary | ICD-10-CM

## 2022-10-07 LAB — POCT PREGNANCY, URINE: Preg Test, Ur: POSITIVE — AB

## 2022-10-07 NOTE — Progress Notes (Signed)
Pt left urine for walk in pregnancy test resulting positive.  Called pt x2 and unable to leave message due to voicemail box not set up.    Alexandria [name]  10/07/22

## 2022-11-05 DIAGNOSIS — Z3490 Encounter for supervision of normal pregnancy, unspecified, unspecified trimester: Secondary | ICD-10-CM | POA: Insufficient documentation

## 2022-11-06 ENCOUNTER — Ambulatory Visit (INDEPENDENT_AMBULATORY_CARE_PROVIDER_SITE_OTHER): Payer: Self-pay | Admitting: Certified Nurse Midwife

## 2022-11-06 ENCOUNTER — Encounter: Payer: Self-pay | Admitting: Certified Nurse Midwife

## 2022-11-06 VITALS — BP 106/61 | HR 91 | Wt 163.2 lb

## 2022-11-06 DIAGNOSIS — Z3A25 25 weeks gestation of pregnancy: Secondary | ICD-10-CM

## 2022-11-06 DIAGNOSIS — Z3490 Encounter for supervision of normal pregnancy, unspecified, unspecified trimester: Secondary | ICD-10-CM

## 2022-11-06 DIAGNOSIS — O0930 Supervision of pregnancy with insufficient antenatal care, unspecified trimester: Secondary | ICD-10-CM

## 2022-11-06 DIAGNOSIS — Z3400 Encounter for supervision of normal first pregnancy, unspecified trimester: Secondary | ICD-10-CM

## 2022-11-06 DIAGNOSIS — O09892 Supervision of other high risk pregnancies, second trimester: Secondary | ICD-10-CM

## 2022-11-06 DIAGNOSIS — O0932 Supervision of pregnancy with insufficient antenatal care, second trimester: Secondary | ICD-10-CM

## 2022-11-06 DIAGNOSIS — Z3492 Encounter for supervision of normal pregnancy, unspecified, second trimester: Secondary | ICD-10-CM

## 2022-11-06 DIAGNOSIS — O09899 Supervision of other high risk pregnancies, unspecified trimester: Secondary | ICD-10-CM | POA: Insufficient documentation

## 2022-11-06 NOTE — Progress Notes (Signed)
History:   Alexandria [name] is a 26 y.o. [phone] at [redacted]w[redacted]d by LMP being seen today for her first obstetrical visit.  Her obstetrical history is significant for  late prenatal care with short interval between pregnancies . Patient does intend to breast feed. Pregnancy history fully reviewed.  Patient reports no complaints.      HISTORY: OB History  Gravida Para Term Preterm AB Living  4 3 3  0 0 3  SAB IAB Ectopic Multiple Live Births  0 0 0 0 3    # Outcome Date GA Lbr Len/2nd Weight Sex Delivery Anes PTL Lv  4 Current           3 Term 03/21/22 [redacted]w[redacted]d 08:01 / 00:11 7 lb 5.8 oz (3.34 kg) F Vag-Spont EPI  LIV     Name: NEKEYA, BRISKI Lamika     Apgar1: 9  Apgar5: 9  2 Term 08/26/18 [redacted]w[redacted]d 05:29 / 00:10 6 lb 12.3 oz (3.07 kg) F Vag-Spont EPI  LIV     Birth Comments: WNL     Name: SAMARRAH, TRANCHINA Lanaya     Apgar1: 8  Apgar5: 9  1 Term 10/08/17 [redacted]w[redacted]d 11:50 / 01:59 6 lb 14.8 oz (3.141 kg) F Vag-Spont EPI  LIV     Name: Ishibashi,GIRL Yoali     Apgar1: 9  Apgar5: 9    Last pap smear date unknown, will complete at postpartum visit.  Past Medical History:  Diagnosis Date   Medical history non-contributory    Past Surgical History:  Procedure Laterality Date   NO PAST SURGERIES     Family History  Problem Relation Age of Onset   Diabetes Maternal Grandmother    Social History   Tobacco Use   Smoking status: Never   Smokeless tobacco: Never  Substance Use Topics   Alcohol use: No   Drug use: No   No Known Allergies Current Outpatient Medications on File Prior to Visit  Medication Sig Dispense Refill   ibuprofen (ADVIL) 600 MG tablet Take 1 tablet (600 mg total) by mouth every 6 (six) hours. 30 tablet 0   acetaminophen (TYLENOL) 325 MG tablet Take 2 tablets (650 mg total) by mouth every 4 (four) hours as needed (for pain scale < 4). (Patient not taking: Reported on 11/06/2022) 30 tablet 0   Prenatal Vit-Fe Fumarate-FA (PRENATAL MULTIVITAMIN)  TABS tablet Take 1 tablet by mouth daily at 12 noon. (Patient not taking: Reported on 11/06/2022)     No current facility-administered medications on file prior to visit.   Review of Systems Pertinent items noted in HPI and remainder of comprehensive ROS otherwise negative.  Physical Exam:   Vitals:   11/06/22 1623  BP: 106/61  Pulse: 91  Weight: 163 lb 3.2 oz (74 kg)   Fetal Heart Rate (bpm): 144  Fundal Height: 23  Constitutional: Well-developed, well-nourished pregnant female in no acute distress.  HEENT: PERRLA Skin: normal color and turgor, no rash Cardiovascular: normal rate & rhythm, warm and well perfused Respiratory: normal effort, no problems with respiration noted GI: Abd soft, non-tender, gravid appropriate for gestational age  MS: Extremities nontender, no edema, normal ROM Neurologic: Alert and oriented x 4.  GU: no CVA tenderness Pelvic: exam deferred  Assessment & Plan:  1. Encounter for supervision of low-risk pregnancy, antepartum - Doing well, started feeling regular fetal movement about 3wks ago - 14/06/23 MFM OB DETAIL +14 WK; Future - Babyscripts Schedule Optimization  2. [redacted] weeks gestation of pregnancy -  FH=23wks - Will get GTT at next visit along with OB labs  3. Late prenatal care affecting pregnancy, antepartum - [place] MFM OB DETAIL +14 WK; Future  4. Short interval between pregnancies affecting pregnancy, antepartum  5. Initial obstetric visit, antepartum - Initial labs ordered for next visit - Continue prenatal vitamins. - Problem list reviewed and updated. - Genetic Screening discussed, First trimester screen, Quad screen, and NIPS: ordered. - Ultrasound discussed; fetal anatomic survey: ordered. - Anticipatory guidance about prenatal visits given including labs, ultrasounds, and testing. - Discussed usage of Babyscripts and virtual visits as additional source of managing and completing prenatal visits in midst of coronavirus and pandemic.   -  Encouraged to complete MyChart Registration for her ability to review results, send requests, and have questions addressed.  - The nature of [place] [facility]Faculty Practice with multiple MDs and Advanced Practice Providers was explained to patient; also emphasized that residents, students are part of our team. - Routine obstetric precautions reviewed. Encouraged to seek out care at office or emergency room [facility] MAU preferred) for urgent and/or emergent concerns. Return in about 2 weeks (around 11/20/2022) for IN-PERSON, LOB/GTT.    Edd Arbour, MSN, CNM, IBCLC Certified Nurse Midwife, [facility]

## 2022-11-07 ENCOUNTER — Encounter: Payer: Self-pay | Admitting: *Deleted

## 2022-11-27 ENCOUNTER — Other Ambulatory Visit: Payer: Self-pay

## 2022-11-27 DIAGNOSIS — Z3490 Encounter for supervision of normal pregnancy, unspecified, unspecified trimester: Secondary | ICD-10-CM

## 2022-11-29 ENCOUNTER — Other Ambulatory Visit (HOSPITAL_COMMUNITY)
Admission: RE | Admit: 2022-11-29 | Discharge: 2022-11-29 | Disposition: A | Discharge status: Home / Self Care | Payer: Self-pay | Source: Ambulatory Visit | Attending: Certified Nurse Midwife | Admitting: Certified Nurse Midwife

## 2022-11-29 ENCOUNTER — Other Ambulatory Visit: Payer: Self-pay

## 2022-11-29 ENCOUNTER — Ambulatory Visit (INDEPENDENT_AMBULATORY_CARE_PROVIDER_SITE_OTHER): Payer: Self-pay | Admitting: Certified Nurse Midwife

## 2022-11-29 VITALS — BP 95/57 | HR 98 | Wt 163.2 lb

## 2022-11-29 DIAGNOSIS — R42 Dizziness and giddiness: Secondary | ICD-10-CM

## 2022-11-29 DIAGNOSIS — N898 Other specified noninflammatory disorders of vagina: Secondary | ICD-10-CM

## 2022-11-29 DIAGNOSIS — Z3A28 28 weeks gestation of pregnancy: Secondary | ICD-10-CM

## 2022-11-29 DIAGNOSIS — Z3493 Encounter for supervision of normal pregnancy, unspecified, third trimester: Secondary | ICD-10-CM

## 2022-11-29 DIAGNOSIS — O26893 Other specified pregnancy related conditions, third trimester: Secondary | ICD-10-CM

## 2022-11-29 DIAGNOSIS — O26899 Other specified pregnancy related conditions, unspecified trimester: Secondary | ICD-10-CM | POA: Insufficient documentation

## 2022-11-29 DIAGNOSIS — Z3490 Encounter for supervision of normal pregnancy, unspecified, unspecified trimester: Secondary | ICD-10-CM

## 2022-11-29 LAB — OB RESULTS CONSOLE HIV ANTIBODY (ROUTINE TESTING): HIV: NONREACTIVE

## 2022-11-29 NOTE — Progress Notes (Signed)
   PRENATAL VISIT NOTE  Subjective:  Alexandria [name] is a 26 y.o. [phone] at [redacted]w[redacted]d being seen today for ongoing prenatal care.  She is currently monitored for the following issues for this low-risk pregnancy and has Supervision of low-risk pregnancy and Short interval between pregnancies affecting pregnancy, antepartum on their problem list.  Patient reports backache and increased vaginal discharge, also reports having fatigue and dizzy spells but no syncope .  Contractions: Irritability. Vag. Bleeding: None.  Movement: Present. Denies leaking of fluid.   The following portions of the patient's history were reviewed and updated as appropriate: allergies, current medications, past family history, past medical history, past social history, past surgical history and problem list.   Objective:   Vitals:   11/29/22 1054  BP: (!) 95/57  Pulse: 98  Weight: 163 lb 3.2 oz (74 kg)    Fetal Status: Fetal Heart Rate (bpm): 137 Fundal Height: 29 cm Movement: Present     General:  Alert, oriented and cooperative. Patient is in no acute distress.  Skin: Skin is warm and dry. No rash noted.   Cardiovascular: Normal heart rate noted  Respiratory: Normal respiratory effort, no problems with respiration noted  Abdomen: Soft, gravid, appropriate for gestational age.  Pain/Pressure: Present     Pelvic: Cervical exam deferred        Extremities: Normal range of motion.  Edema: None  Mental Status: Normal mood and affect. Normal behavior. Normal judgment and thought content.   Assessment and Plan:  Pregnancy: G4P3003 at [redacted]w[redacted]d 1. Encounter for supervision of low-risk pregnancy in third trimester - Doing well, feeling regular and vigorous fetal movement   2. [redacted] weeks gestation of pregnancy - Routine OB care including GTT today  3. Vaginal discharge during pregnancy, antepartum - Cervicovaginal ancillary only  4. Dizzy spells - Explained that this is a common time in pregnancy for  anemia to cause fatigue/dizziness and that hydration and proper nutrition become very important. Offered a visit to the [employer] which the patient accepted. - Will follow results of CBC/GTT and manage accordingly   Preterm labor symptoms and general obstetric precautions including but not limited to vaginal bleeding, contractions, leaking of fluid and fetal movement were reviewed in detail with the patient. Please refer to After Visit Summary for other counseling recommendations.   Return in about 2 weeks (around 12/13/2022) for IN-PERSON, LOB.  Future Appointments  Date Time Provider Department Center  12/13/2022  7:30 AM [facility] NURSE [facility]  12/13/2022  7:45 AM [facility] US5 [facility]  12/13/2022  9:55 AM Kathlene Cote [facility]  12/27/2022 10:15 AM Bernerd Limbo, CNM [facility]    Bernerd Limbo, CNM

## 2022-11-30 LAB — CBC
Hematocrit: 29.6 % — ABNORMAL LOW (ref 34.0–46.6)
Hemoglobin: 9.6 g/dL — ABNORMAL LOW (ref 11.1–15.9)
MCH: 26.1 pg — ABNORMAL LOW (ref 26.6–33.0)
MCHC: 32.4 g/dL (ref 31.5–35.7)
MCV: 80 fL (ref 79–97)
Platelets: 215 10*3/uL (ref 150–450)
RBC: 3.68 x10E6/uL — ABNORMAL LOW (ref 3.77–5.28)
RDW: 13.5 % (ref 11.7–15.4)
WBC: 7.8 10*3/uL (ref 3.4–10.8)

## 2022-11-30 LAB — GLUCOSE TOLERANCE, 2 HOURS W/ 1HR
Glucose, 1 hour: 167 mg/dL (ref 70–179)
Glucose, 2 hour: 115 mg/dL (ref 70–152)
Glucose, Fasting: 74 mg/dL (ref 70–91)

## 2022-11-30 LAB — HIV ANTIBODY (ROUTINE TESTING W REFLEX): HIV Screen 4th Generation wRfx: NONREACTIVE

## 2022-11-30 LAB — RPR: RPR Ser Ql: NONREACTIVE

## 2022-12-02 ENCOUNTER — Other Ambulatory Visit: Payer: Self-pay | Admitting: Certified Nurse Midwife

## 2022-12-02 DIAGNOSIS — D509 Iron deficiency anemia, unspecified: Secondary | ICD-10-CM | POA: Insufficient documentation

## 2022-12-02 DIAGNOSIS — O09899 Supervision of other high risk pregnancies, unspecified trimester: Secondary | ICD-10-CM

## 2022-12-02 NOTE — L&D Delivery Note (Signed)
LABOR COURSE Patient presented for labor check with SROM in MAU. Patient with minimal change, AROM of forebag and pitocin with subsequent progression to complete and pushing.   Delivery Note Called to room and patient was complete and pushing. Head delivered easily. No nuchal cord present. Shoulder and body delivered in usual fashion. NVSB @  [id number] a viable female was delivered OA to ROA position. Infant with spontaneous cry, placed on mother's abdomen, dried and stimulated. Cord clamped x 2 after cessation of pulsation, and cut by Conan Bowens, SNM per patient preference. Cord blood drawn. Placenta delivered spontaneously with gentle cord traction. Appears intact. Fundus firm with massage and Pitocin. Labia, perineum, vagina, and cervix inspected with sterile gauze, revealing intact tissues .    APGAR: 10 ,10 ; weight  pending.   Cord: 3VC with the following complications:none.   Cord pH: not indicated  Anesthesia:  epidural Episiotomy: None Lacerations:  none  Est. Blood Loss (mL): 123  Mom to postpartum.  Baby to "Alexandria [name]" Couplet care / Skin to Skin.     Conan Bowens, SNM 4:48 PM

## 2022-12-02 NOTE — Progress Notes (Signed)
Infusion orders placed for Alexandria [name] at [redacted]w[redacted]d with iron deficiency anemia in pregnancy based on most recent hemoglobin of 9.6, c/o dizziness, fatigue and near syncopal episodes.  Gaylan Gerold, CNM, MSN, [place] Certified Nurse Midwife, [facility] 12/02/22 2:25 PM

## 2022-12-03 LAB — CERVICOVAGINAL ANCILLARY ONLY
Bacterial Vaginitis (gardnerella): NEGATIVE
Candida Glabrata: NEGATIVE
Candida Vaginitis: POSITIVE — AB
Chlamydia: NEGATIVE
Comment: NEGATIVE
Comment: NEGATIVE
Comment: NEGATIVE
Comment: NEGATIVE
Comment: NEGATIVE
Comment: NORMAL
Neisseria Gonorrhea: NEGATIVE
Trichomonas: NEGATIVE

## 2022-12-10 MED ORDER — TERCONAZOLE 0.4 % VA CREA: 1.0000 | TOPICAL_CREAM | Freq: Every day | VAGINAL | 0 refills | Status: DC

## 2022-12-10 NOTE — Addendum Note (Signed)
Addended by: Gaylan Gerold on: 12/10/2022 09:06 PM   Modules accepted: Orders

## 2022-12-13 ENCOUNTER — Other Ambulatory Visit: Payer: Self-pay | Admitting: *Deleted

## 2022-12-13 ENCOUNTER — Ambulatory Visit (INDEPENDENT_AMBULATORY_CARE_PROVIDER_SITE_OTHER): Payer: Self-pay | Admitting: Medical

## 2022-12-13 ENCOUNTER — Ambulatory Visit: Payer: Self-pay | Admitting: *Deleted

## 2022-12-13 ENCOUNTER — Ambulatory Visit: Payer: Self-pay | Attending: Obstetrics and Gynecology

## 2022-12-13 ENCOUNTER — Encounter: Payer: Self-pay | Admitting: *Deleted

## 2022-12-13 ENCOUNTER — Encounter: Payer: Self-pay | Admitting: Medical

## 2022-12-13 VITALS — BP 106/55 | HR 96

## 2022-12-13 VITALS — BP 104/60 | HR 92 | Wt 165.0 lb

## 2022-12-13 DIAGNOSIS — O09899 Supervision of other high risk pregnancies, unspecified trimester: Secondary | ICD-10-CM

## 2022-12-13 DIAGNOSIS — Z363 Encounter for antenatal screening for malformations: Secondary | ICD-10-CM | POA: Insufficient documentation

## 2022-12-13 DIAGNOSIS — D509 Iron deficiency anemia, unspecified: Secondary | ICD-10-CM

## 2022-12-13 DIAGNOSIS — O0933 Supervision of pregnancy with insufficient antenatal care, third trimester: Secondary | ICD-10-CM | POA: Insufficient documentation

## 2022-12-13 DIAGNOSIS — Z3A30 30 weeks gestation of pregnancy: Secondary | ICD-10-CM | POA: Insufficient documentation

## 2022-12-13 DIAGNOSIS — O0930 Supervision of pregnancy with insufficient antenatal care, unspecified trimester: Secondary | ICD-10-CM

## 2022-12-13 DIAGNOSIS — Z3490 Encounter for supervision of normal pregnancy, unspecified, unspecified trimester: Secondary | ICD-10-CM

## 2022-12-13 DIAGNOSIS — O99019 Anemia complicating pregnancy, unspecified trimester: Secondary | ICD-10-CM

## 2022-12-13 DIAGNOSIS — O99013 Anemia complicating pregnancy, third trimester: Secondary | ICD-10-CM | POA: Insufficient documentation

## 2022-12-13 DIAGNOSIS — Z3493 Encounter for supervision of normal pregnancy, unspecified, third trimester: Secondary | ICD-10-CM

## 2022-12-13 NOTE — Progress Notes (Signed)
   PRENATAL VISIT NOTE  Subjective:  Alexandria [name] is a 27 y.o. [phone] at [redacted]w[redacted]d being seen today for ongoing prenatal care.  She is currently monitored for the following issues for this low-risk pregnancy and has Supervision of low-risk pregnancy; Short interval between pregnancies affecting pregnancy, antepartum; and Iron deficiency anemia during pregnancy on their problem list.  Patient reports occasional contractions.  Contractions: Irritability. Vag. Bleeding: None.  Movement: Present. Denies leaking of fluid.   The following portions of the patient's history were reviewed and updated as appropriate: allergies, current medications, past family history, past medical history, past social history, past surgical history and problem list.   Objective:   Vitals:   12/13/22 1020  BP: 104/60  Pulse: 92  Weight: 165 lb (74.8 kg)    Fetal Status: Fetal Heart Rate (bpm): 155 Fundal Height: 29 cm Movement: Present     General:  Alert, oriented and cooperative. Patient is in no acute distress.  Skin: Skin is warm and dry. No rash noted.   Cardiovascular: Normal heart rate noted  Respiratory: Normal respiratory effort, no problems with respiration noted  Abdomen: Soft, gravid, appropriate for gestational age.  Pain/Pressure: Absent     Pelvic: Cervical exam deferred        Extremities: Normal range of motion.  Edema: None  Mental Status: Normal mood and affect. Normal behavior. Normal judgment and thought content.   Assessment and Plan:  Pregnancy: G4P3003 at [redacted]w[redacted]d 1. Encounter for supervision of low-risk pregnancy in third trimester - TDAP letter given for [facility] - Normal GTT reviewed from last visit  - Will pick up Terazol for yeast   2. Short interval between pregnancies affecting pregnancy, antepartum  3. Iron deficiency anemia during pregnancy - Will pick up iron supplement today   4. [redacted] weeks gestation of pregnancy  Preterm labor symptoms and general obstetric  precautions including but not limited to vaginal bleeding, contractions, leaking of fluid and fetal movement were reviewed in detail with the patient. Please refer to After Visit Summary for other counseling recommendations.   No follow-ups on file.  Future Appointments  Date Time Provider [place]  12/27/2022 10:15 AM Helaine Chess [facility]  01/10/2023 10:30 AM [facility] NURSE [place] [facility]  01/10/2023 10:45 AM [facility] US4 [facility]    Kerry Hough, PA-C

## 2022-12-26 ENCOUNTER — Encounter: Payer: Self-pay | Admitting: Certified Nurse Midwife

## 2022-12-27 ENCOUNTER — Other Ambulatory Visit: Payer: Self-pay

## 2022-12-27 ENCOUNTER — Ambulatory Visit (INDEPENDENT_AMBULATORY_CARE_PROVIDER_SITE_OTHER): Payer: Self-pay | Admitting: Certified Nurse Midwife

## 2022-12-27 VITALS — BP 101/60 | HR 88 | Wt 163.4 lb

## 2022-12-27 DIAGNOSIS — D509 Iron deficiency anemia, unspecified: Secondary | ICD-10-CM

## 2022-12-27 DIAGNOSIS — Z3493 Encounter for supervision of normal pregnancy, unspecified, third trimester: Secondary | ICD-10-CM

## 2022-12-27 DIAGNOSIS — Z3A32 32 weeks gestation of pregnancy: Secondary | ICD-10-CM

## 2022-12-27 DIAGNOSIS — O99019 Anemia complicating pregnancy, unspecified trimester: Secondary | ICD-10-CM

## 2022-12-27 DIAGNOSIS — K529 Noninfective gastroenteritis and colitis, unspecified: Secondary | ICD-10-CM

## 2022-12-27 MED ORDER — ONDANSETRON 4 MG PO TBDP: 4.0000 mg | ORAL_TABLET | Freq: Three times a day (TID) | ORAL | 0 refills | Status: DC | PRN

## 2022-12-27 NOTE — Progress Notes (Signed)
   PRENATAL VISIT NOTE  Subjective:  Alexandria [name] is a 27 y.o. [phone] at [redacted]w[redacted]d being seen today for ongoing prenatal care.  She is currently monitored for the following issues for this low-risk pregnancy and has Supervision of low-risk pregnancy; Short interval between pregnancies affecting pregnancy, antepartum; and Iron deficiency anemia during pregnancy on their problem list.  Patient reports  has had nausea/vomiting/diarrhea x2 days, as has everyone in her family. Denies abdominal pain or cramping .  Contractions: Irritability. Vag. Bleeding: None.  Movement: Present. Denies leaking of fluid.   The following portions of the patient's history were reviewed and updated as appropriate: allergies, current medications, past family history, past medical history, past social history, past surgical history and problem list.   Objective:   Vitals:   12/27/22 1049  BP: 101/60  Pulse: 88  Weight: 163 lb 6.4 oz (74.1 kg)    Fetal Status: Fetal Heart Rate (bpm): 134 Fundal Height: 32 cm Movement: Present     General:  Alert, oriented and cooperative. Patient is in no acute distress.  Skin: Skin is warm and dry. No rash noted.   Cardiovascular: Normal heart rate noted  Respiratory: Normal respiratory effort, no problems with respiration noted  Abdomen: Soft, gravid, appropriate for gestational age.  Pain/Pressure: Present     Pelvic: Cervical exam deferred        Extremities: Normal range of motion.  Edema: None  Mental Status: Normal mood and affect. Normal behavior. Normal judgment and thought content.   Assessment and Plan:  Pregnancy: [id number] at [redacted]w[redacted]d 1. Encounter for supervision of low-risk pregnancy in third trimester - Doing well, feeling regular and vigorous fetal movement   2. [redacted] weeks gestation of pregnancy - Routine OB care   3. Iron deficiency anemia during pregnancy - Taking oral iron  4. Gastroenteritis - Gave instructions for managing GI infection and  advancing oral intake as tolerated - ondansetron (ZOFRAN-ODT) 4 MG disintegrating tablet; Take 1 tablet (4 mg total) by mouth every 8 (eight) hours as needed for nausea or vomiting.  Dispense: 15 tablet; Refill: 0  Preterm labor symptoms and general obstetric precautions including but not limited to vaginal bleeding, contractions, leaking of fluid and fetal movement were reviewed in detail with the patient. Please refer to After Visit Summary for other counseling recommendations.   Return in about 2 weeks (around 01/10/2023) for IN-PERSON, LOB.  Future Appointments  Date Time Provider [place]  01/10/2023 10:30 AM [facility] NURSE [facility]  01/10/2023 10:45 AM [facility] US4 [facility]  01/14/2023  1:15 PM Deloris Ping, CNM [facility]   Gabriel Carina, CNM

## 2023-01-06 NOTE — Progress Notes (Signed)
Alert received through NCNotify system as follows: Patient identified as not having a recommended prenatal visit. Patient is [redacted] weeks pregnant. Most recent prenatal visit was on 12/13/2022 at [place].  Chart review: Patient's last visit was on 12/27/2022. Her next appointment is scheduled for 01/14/2023.   No action needed.  Martina Sinner, RN 01/06/2023  9:10 AM

## 2023-01-10 ENCOUNTER — Ambulatory Visit: Payer: Self-pay | Attending: Obstetrics

## 2023-01-10 ENCOUNTER — Ambulatory Visit: Payer: Self-pay

## 2023-01-10 VITALS — BP 104/56 | HR 96

## 2023-01-10 DIAGNOSIS — Z3493 Encounter for supervision of normal pregnancy, unspecified, third trimester: Secondary | ICD-10-CM

## 2023-01-10 DIAGNOSIS — D509 Iron deficiency anemia, unspecified: Secondary | ICD-10-CM | POA: Diagnosis not present

## 2023-01-10 DIAGNOSIS — O09899 Supervision of other high risk pregnancies, unspecified trimester: Secondary | ICD-10-CM

## 2023-01-10 DIAGNOSIS — O99019 Anemia complicating pregnancy, unspecified trimester: Secondary | ICD-10-CM | POA: Diagnosis present

## 2023-01-10 DIAGNOSIS — Z3A37 37 weeks gestation of pregnancy: Secondary | ICD-10-CM

## 2023-01-10 DIAGNOSIS — O99013 Anemia complicating pregnancy, third trimester: Secondary | ICD-10-CM

## 2023-01-10 DIAGNOSIS — O09293 Supervision of pregnancy with other poor reproductive or obstetric history, third trimester: Secondary | ICD-10-CM

## 2023-01-10 DIAGNOSIS — O0933 Supervision of pregnancy with insufficient antenatal care, third trimester: Secondary | ICD-10-CM | POA: Diagnosis not present

## 2023-01-14 ENCOUNTER — Other Ambulatory Visit: Payer: Self-pay

## 2023-01-14 ENCOUNTER — Ambulatory Visit (INDEPENDENT_AMBULATORY_CARE_PROVIDER_SITE_OTHER): Payer: Self-pay | Admitting: Certified Nurse Midwife

## 2023-01-14 VITALS — BP 120/63 | HR 100 | Wt 166.0 lb

## 2023-01-14 DIAGNOSIS — Z3493 Encounter for supervision of normal pregnancy, unspecified, third trimester: Secondary | ICD-10-CM

## 2023-01-14 DIAGNOSIS — Z3A38 38 weeks gestation of pregnancy: Secondary | ICD-10-CM

## 2023-01-14 DIAGNOSIS — Z3A35 35 weeks gestation of pregnancy: Secondary | ICD-10-CM

## 2023-01-14 LAB — OB RESULTS CONSOLE GBS: GBS: NEGATIVE

## 2023-01-14 NOTE — Progress Notes (Signed)
   PRENATAL VISIT NOTE  Subjective:  Alexandria [name] is a 27 y.o. [phone] at [redacted]w[redacted]d being seen today for ongoing prenatal care.  She is currently monitored for the following issues for this low-risk pregnancy and has Supervision of low-risk pregnancy; Short interval between pregnancies affecting pregnancy, antepartum; and Iron deficiency anemia during pregnancy on their problem list.  Patient reports  vaginal pressure but denies Contractions .  Contractions: Irritability. Vag. Bleeding: None.  Movement: Present. Denies leaking of fluid.   The following portions of the patient's history were reviewed and updated as appropriate: allergies, current medications, past family history, past medical history, past social history, past surgical history and problem list.   Objective:   Vitals:   01/14/23 1341  BP: 120/63  Pulse: 100  Weight: 166 lb (75.3 kg)    Fetal Status: Fetal Heart Rate (bpm): 140   Movement: Present     General:  Alert, oriented and cooperative. Patient is in no acute distress.  Skin: Skin is warm and dry. No rash noted.   Cardiovascular: Normal heart rate noted  Respiratory: Normal respiratory effort, no problems with respiration noted  Abdomen: Soft, gravid, appropriate for gestational age.  Pain/Pressure: Present     Pelvic: Cervical exam deferred        Extremities: Normal range of motion.  Edema: None  Mental Status: Normal mood and affect. Normal behavior. Normal judgment and thought content.   Assessment and Plan:  Pregnancy: G4P3003 at [redacted]w[redacted]d 1. Encounter for supervision of low-risk pregnancy in third trimester - Patient seen by MFM and due date Changed based on fetal growth and size. Patient previously 33 weeks and now measuring ~38 weeks. (See MFM Note)  - Based on New Dates GBS collection today.  - Culture, beta strep (group b only)  2. [redacted] weeks gestation of pregnancy - Patient feeling frequent and vigorous fetal movement.    Preterm labor  symptoms and general obstetric precautions including but not limited to vaginal bleeding, contractions, leaking of fluid and fetal movement were reviewed in detail with the patient. Please refer to After Visit Summary for other counseling recommendations.   Return in about 1 week (around 01/21/2023) for LOB.  Future Appointments  Date Time Provider Department Center  01/21/2023 10:15 AM Donna Bernard [facility]  01/28/2023  1:15 PM Armando Reichert, CNM [facility]  02/03/2023  1:15 PM Alfredia Ferguson, MD [facility]  02/03/2023  2:15 PM [facility]   [employer] Danella Deis) Suzie Portela, MSN, CNM  Center for [facility]  01/15/23 1:59 PM

## 2023-01-17 ENCOUNTER — Inpatient Hospital Stay (HOSPITAL_COMMUNITY): Payer: Medicaid Other | Admitting: Anesthesiology

## 2023-01-17 ENCOUNTER — Encounter (HOSPITAL_COMMUNITY): Payer: Self-pay | Admitting: Obstetrics and Gynecology

## 2023-01-17 ENCOUNTER — Encounter: Payer: Self-pay | Admitting: Obstetrics and Gynecology

## 2023-01-17 ENCOUNTER — Other Ambulatory Visit: Payer: Self-pay

## 2023-01-17 ENCOUNTER — Inpatient Hospital Stay (HOSPITAL_COMMUNITY)
Admission: AD | Admit: 2023-01-17 | Discharge: 2023-01-19 | DRG: 807 | Disposition: A | Discharge status: Home / Self Care | Payer: Medicaid Other | Attending: Obstetrics and Gynecology | Admitting: Obstetrics and Gynecology

## 2023-01-17 DIAGNOSIS — D509 Iron deficiency anemia, unspecified: Secondary | ICD-10-CM | POA: Diagnosis present

## 2023-01-17 DIAGNOSIS — O9902 Anemia complicating childbirth: Secondary | ICD-10-CM | POA: Diagnosis present

## 2023-01-17 DIAGNOSIS — O26893 Other specified pregnancy related conditions, third trimester: Secondary | ICD-10-CM | POA: Diagnosis present

## 2023-01-17 DIAGNOSIS — O0932 Supervision of pregnancy with insufficient antenatal care, second trimester: Secondary | ICD-10-CM

## 2023-01-17 DIAGNOSIS — Z3A38 38 weeks gestation of pregnancy: Secondary | ICD-10-CM | POA: Diagnosis not present

## 2023-01-17 LAB — GROUP B STREP BY PCR: Group B strep by PCR: NEGATIVE

## 2023-01-17 LAB — CBC
HCT: 35.6 % — ABNORMAL LOW (ref 36.0–46.0)
Hemoglobin: 11.2 g/dL — ABNORMAL LOW (ref 12.0–15.0)
MCH: 27.1 pg (ref 26.0–34.0)
MCHC: 31.5 g/dL (ref 30.0–36.0)
MCV: 86.2 fL (ref 80.0–100.0)
Platelets: 169 10*3/uL (ref 150–400)
RBC: 4.13 MIL/uL (ref 3.87–5.11)
RDW: 18.6 % — ABNORMAL HIGH (ref 11.5–15.5)
WBC: 9.1 10*3/uL (ref 4.0–10.5)
nRBC: 0 % (ref 0.0–0.2)

## 2023-01-17 LAB — TYPE AND SCREEN
ABO/RH(D): O POS
Antibody Screen: NEGATIVE

## 2023-01-17 LAB — RPR: RPR Ser Ql: NONREACTIVE

## 2023-01-17 LAB — HEPATITIS B SURFACE ANTIGEN: Hepatitis B Surface Ag: NONREACTIVE

## 2023-01-17 LAB — OB RESULTS CONSOLE RPR: RPR: NONREACTIVE

## 2023-01-17 LAB — OB RESULTS CONSOLE HEPATITIS B SURFACE ANTIGEN: Hepatitis B Surface Ag: NEGATIVE

## 2023-01-17 LAB — POCT FERN TEST: POCT Fern Test: POSITIVE

## 2023-01-17 MED ORDER — EPHEDRINE 5 MG/ML INJ
10.0000 mg | INTRAVENOUS | Status: DC | PRN
  Administered 2023-01-17: 10 mg via INTRAVENOUS

## 2023-01-17 MED ORDER — PHENYLEPHRINE 80 MCG/ML (10ML) SYRINGE FOR IV PUSH (FOR BLOOD PRESSURE SUPPORT)
80.0000 ug | PREFILLED_SYRINGE | INTRAVENOUS | Status: DC | PRN
  Filled 2023-01-17: qty 10

## 2023-01-17 MED ORDER — OXYTOCIN BOLUS FROM INFUSION
333.0000 mL | Freq: Once | INTRAVENOUS | Status: AC
  Administered 2023-01-17 (×2): 333 mL via INTRAVENOUS

## 2023-01-17 MED ORDER — IBUPROFEN 600 MG PO TABS: 600.0000 mg | ORAL_TABLET | Freq: Once | ORAL | Status: DC

## 2023-01-17 MED ORDER — WITCH HAZEL-GLYCERIN EX PADS: 1.0000 | MEDICATED_PAD | CUTANEOUS | Status: DC | PRN

## 2023-01-17 MED ORDER — OXYTOCIN-SODIUM CHLORIDE 30-0.9 UT/500ML-% IV SOLN
20.0000 [IU]/h | INTRAVENOUS | Status: DC
  Administered 2023-01-17: 20 [IU]/h via INTRAVENOUS

## 2023-01-17 MED ORDER — DIBUCAINE (PERIANAL) 1 % EX OINT: 1.0000 | TOPICAL_OINTMENT | CUTANEOUS | Status: DC | PRN

## 2023-01-17 MED ORDER — PHENYLEPHRINE 80 MCG/ML (10ML) SYRINGE FOR IV PUSH (FOR BLOOD PRESSURE SUPPORT)
80.0000 ug | PREFILLED_SYRINGE | INTRAVENOUS | Status: DC | PRN
  Administered 2023-01-17: 80 ug via INTRAVENOUS

## 2023-01-17 MED ORDER — OXYCODONE-ACETAMINOPHEN 5-325 MG PO TABS: 2.0000 | ORAL_TABLET | ORAL | Status: DC | PRN

## 2023-01-17 MED ORDER — ONDANSETRON HCL 4 MG PO TABS: 4.0000 mg | ORAL_TABLET | ORAL | Status: DC | PRN

## 2023-01-17 MED ORDER — LACTATED RINGERS IV SOLN: 500.0000 mL | Freq: Once | INTRAVENOUS | Status: DC

## 2023-01-17 MED ORDER — CEFAZOLIN SODIUM-DEXTROSE 2-4 GM/100ML-% IV SOLN: 2.0000 g | Freq: Once | INTRAVENOUS | Status: DC

## 2023-01-17 MED ORDER — MAGNESIUM HYDROXIDE 400 MG/5ML PO SUSP: 30.0000 mL | ORAL | Status: DC | PRN

## 2023-01-17 MED ORDER — ONDANSETRON HCL 4 MG/2ML IJ SOLN: 4.0000 mg | INTRAMUSCULAR | Status: DC | PRN

## 2023-01-17 MED ORDER — SIMETHICONE 80 MG PO CHEW: 80.0000 mg | CHEWABLE_TABLET | ORAL | Status: DC | PRN

## 2023-01-17 MED ORDER — TERBUTALINE SULFATE 1 MG/ML IJ SOLN: 0.2500 mg | Freq: Once | INTRAMUSCULAR | Status: DC | PRN

## 2023-01-17 MED ORDER — DIPHENHYDRAMINE HCL 50 MG/ML IJ SOLN: 12.5000 mg | INTRAMUSCULAR | Status: DC | PRN

## 2023-01-17 MED ORDER — SOD CITRATE-CITRIC ACID 500-334 MG/5ML PO SOLN: 30.0000 mL | ORAL | Status: DC | PRN

## 2023-01-17 MED ORDER — DIPHENHYDRAMINE HCL 25 MG PO CAPS: 25.0000 mg | ORAL_CAPSULE | Freq: Four times a day (QID) | ORAL | Status: DC | PRN

## 2023-01-17 MED ORDER — ONDANSETRON HCL 4 MG/2ML IJ SOLN: 4.0000 mg | Freq: Four times a day (QID) | INTRAMUSCULAR | Status: DC | PRN

## 2023-01-17 MED ORDER — LACTATED RINGERS IV SOLN
500.0000 mL | INTRAVENOUS | Status: DC | PRN
  Administered 2023-01-17: 1000 mL via INTRAVENOUS

## 2023-01-17 MED ORDER — OXYTOCIN-SODIUM CHLORIDE 30-0.9 UT/500ML-% IV SOLN
2.5000 [IU]/h | INTRAVENOUS | Status: DC
  Administered 2023-01-17: 2.5 [IU]/h via INTRAVENOUS
  Filled 2023-01-17: qty 500

## 2023-01-17 MED ORDER — ACETAMINOPHEN 325 MG PO TABS: 650.0000 mg | ORAL_TABLET | ORAL | Status: DC | PRN

## 2023-01-17 MED ORDER — ACETAMINOPHEN 325 MG PO TABS
650.0000 mg | ORAL_TABLET | ORAL | Status: DC | PRN
  Administered 2023-01-18: 650 mg via ORAL
  Filled 2023-01-17: qty 2

## 2023-01-17 MED ORDER — LACTATED RINGERS IV SOLN
INTRAVENOUS | Status: DC
  Administered 2023-01-17: 125 mL/h via INTRAVENOUS

## 2023-01-17 MED ORDER — COCONUT OIL OIL: 1.0000 | TOPICAL_OIL | Status: DC | PRN

## 2023-01-17 MED ORDER — FENTANYL-BUPIVACAINE-NACL 0.5-0.125-0.9 MG/250ML-% EP SOLN
EPIDURAL | Status: DC | PRN
  Administered 2023-01-17: 12 mL/h via EPIDURAL

## 2023-01-17 MED ORDER — OXYTOCIN-SODIUM CHLORIDE 30-0.9 UT/500ML-% IV SOLN
1.0000 m[IU]/min | INTRAVENOUS | Status: DC
  Administered 2023-01-17: 2 m[IU]/min via INTRAVENOUS
  Filled 2023-01-17: qty 500

## 2023-01-17 MED ORDER — LIDOCAINE HCL (PF) 1 % IJ SOLN: 30.0000 mL | INTRAMUSCULAR | Status: DC | PRN

## 2023-01-17 MED ORDER — LIDOCAINE HCL (PF) 1 % IJ SOLN
INTRAMUSCULAR | Status: DC | PRN
  Administered 2023-01-17: 2 mL via EPIDURAL
  Administered 2023-01-17: 10 mL via EPIDURAL

## 2023-01-17 MED ORDER — BENZOCAINE-MENTHOL 20-0.5 % EX AERO: 1.0000 | INHALATION_SPRAY | CUTANEOUS | Status: DC | PRN

## 2023-01-17 MED ORDER — FENTANYL-BUPIVACAINE-NACL 0.5-0.125-0.9 MG/250ML-% EP SOLN
12.0000 mL/h | EPIDURAL | Status: DC | PRN
  Filled 2023-01-17: qty 250

## 2023-01-17 MED ORDER — IBUPROFEN 600 MG PO TABS
600.0000 mg | ORAL_TABLET | Freq: Four times a day (QID) | ORAL | Status: DC
  Administered 2023-01-17 – 2023-01-19 (×7): 600 mg via ORAL
  Filled 2023-01-17 (×7): qty 1

## 2023-01-17 MED ORDER — EPHEDRINE 5 MG/ML INJ
10.0000 mg | INTRAVENOUS | Status: DC | PRN
  Administered 2023-01-17: 10 mg via INTRAVENOUS
  Filled 2023-01-17: qty 5

## 2023-01-17 MED ORDER — MEDROXYPROGESTERONE ACETATE 150 MG/ML IM SUSP
150.0000 mg | INTRAMUSCULAR | Status: AC | PRN
  Administered 2023-01-19: 150 mg via INTRAMUSCULAR
  Filled 2023-01-17: qty 1

## 2023-01-17 MED ORDER — METHYLERGONOVINE MALEATE 0.2 MG/ML IJ SOLN
INTRAMUSCULAR | Status: AC
  Administered 2023-01-17: 0.2 mg
  Filled 2023-01-17: qty 1

## 2023-01-17 MED ORDER — OXYCODONE-ACETAMINOPHEN 5-325 MG PO TABS: 1.0000 | ORAL_TABLET | ORAL | Status: DC | PRN

## 2023-01-17 MED ORDER — OXYCODONE HCL 5 MG PO TABS
5.0000 mg | ORAL_TABLET | Freq: Once | ORAL | Status: AC
  Administered 2023-01-17: 5 mg via ORAL
  Filled 2023-01-17: qty 1

## 2023-01-17 MED ORDER — PRENATAL MULTIVITAMIN CH
1.0000 | ORAL_TABLET | Freq: Every day | ORAL | Status: DC
  Administered 2023-01-18 – 2023-01-19 (×2): 1 via ORAL
  Filled 2023-01-17 (×2): qty 1

## 2023-01-17 NOTE — MAU Note (Signed)
.  Alexandria [name] is a 27 y.o. at 20w3dhere in MAU reporting: ctx every 3-5 minutes since 0300 (8/10). She has also felt no fetal movement since the ctx started at 0300. Denies LOF. Reports some light, pink spotting when she wipes that is mixed with mucus.  LMP: N/A Onset of complaint: On-going Pain score: 8/10 Vitals:   01/17/23 0859  BP: 111/62  Pulse: 92  Resp: 20  Temp: 97.7 F (36.5 C)  SpO2: 100%     FHT:145

## 2023-01-17 NOTE — Progress Notes (Signed)
Alexandria [name] is a 27 y.o. [phone] at [redacted]w[redacted]d Subjective:  S/p epidural, very comfortable. Denies pain. Denies perineal pressure or urge to push.   Objective: BP 110/60   Pulse 89   Temp 98.1 F (36.7 C) (Oral)   Resp 18   Ht 5' 2"$  (1.575 m)   Wt 74.8 kg   LMP 05/11/2022 (Approximate)   SpO2 100%   BMI 30.18 kg/m  No intake/output data recorded. No intake/output data recorded.  FHT:  FHR: 145 bpm, variability: moderate,  accelerations:  Present,  decelerations:  Absent UC:   irregular, every 1-4 minutes SVE:   Dilation: 7 Effacement (%): 70 Station: -2 Exam by:: JDuke EnergyRN  Labs: Lab Results  Component Value Date   WBC 9.1 01/17/2023   HGB 11.2 (L) 01/17/2023   HCT 35.6 (L) 01/17/2023   MCV 86.2 01/17/2023   PLT 169 01/17/2023    Assessment / Plan: --27y.o. G4P3003 at 344w3ddmitted for SOL with SROM at 0930 today --Afebrile, clear fluid, Cat I tracing --Checked by RN at 1145 --Will plan to check no later than 1630 --Plan for augmentation with Pitocin if no change at next exam --GBS Neg --Anticipate vaginal birth  SaDarlina RumpfCNNorth [place]/16/2024, 2:21 PM

## 2023-01-17 NOTE — Anesthesia Procedure Notes (Signed)
Epidural Patient location during procedure: OB Start time: 01/17/2023 11:12 AM End time: 01/17/2023 11:20 AM  Staffing Anesthesiologist: Pervis Hocking, DO Performed: anesthesiologist   Preanesthetic Checklist Completed: patient identified, IV checked, risks and benefits discussed, monitors and equipment checked, pre-op evaluation and timeout performed  Epidural Patient position: sitting Prep: DuraPrep and site prepped and draped Patient monitoring: continuous pulse ox, blood pressure, heart rate and cardiac monitor Approach: midline Location: L3-L4 Injection technique: LOR air  Needle:  Needle type: Tuohy  Needle gauge: 17 G Needle length: 9 cm Needle insertion depth: 5 cm Catheter type: closed end flexible Catheter size: 19 Gauge Catheter at skin depth: 10 cm Test dose: negative  Assessment Sensory level: T8 Events: blood not aspirated, no cerebrospinal fluid, injection not painful, no injection resistance, no paresthesia and negative IV test  Additional Notes Patient identified. Risks/Benefits/Options discussed with patient including but not limited to bleeding, infection, nerve damage, paralysis, failed block, incomplete pain control, headache, blood pressure changes, nausea, vomiting, reactions to medication both or allergic, itching and postpartum back pain. Confirmed with bedside nurse the patient's most recent platelet count. Confirmed with patient that they are not currently taking any anticoagulation, have any bleeding history or any family history of bleeding disorders. Patient expressed understanding and wished to proceed. All questions were answered. Sterile technique was used throughout the entire procedure. Please see nursing notes for vital signs. Test dose was given through epidural catheter and negative prior to continuing to dose epidural or start infusion. Warning signs of high block given to the patient including shortness of breath, tingling/numbness in  hands, complete motor block, or any concerning symptoms with instructions to call for help. Patient was given instructions on fall risk and not to get out of bed. All questions and concerns addressed with instructions to call with any issues or inadequate analgesia.  Reason for block:procedure for pain

## 2023-01-17 NOTE — Progress Notes (Signed)
OB/GYN Faculty Practice: Labor Progress Note  Subjective: Coping well, comfortable following epidural placement.   Objective: BP 103/74   Pulse 84   Temp 98.2 F (36.8 C) (Oral)   Resp 16   Ht 5' 2"$  (1.575 m)   Wt 74.8 kg   LMP 05/11/2022 (Approximate)   SpO2 100%   BMI 30.18 kg/m   Dilation: 7 Effacement (%): 80, 90 Station: -2 Presentation: Vertex Exam by:: Clarise Cruz Midwife Student  Assessment and Plan: 27 y.o. [id number] 62w3dpresenting for SROM.   Labor: Patient with limited progress since last check. Forebag noted on SVE, AROM of forebag, moderate clear fluid noted. Anticipation progression of cervical dilation, plan administration of pitocin 2x2.  -- pain control: epidural, patient comfortable -- PPH Risk: low  Fetal Well-Being: EFW AGA. Cephalic by sutures on SVE.  -- Category I - reassuring fetal monitoring  -- GBS negative   SConan Bowens SNM Student Nurse Midwife 3:55 PM

## 2023-01-17 NOTE — Discharge Instructions (Signed)

## 2023-01-17 NOTE — Progress Notes (Signed)
Post Partum Day 0  Subjective: --comfortable with epidural, denies pain or discomfort with internal exam  Objective: Blood pressure 106/67, pulse 65, temperature 98.2 F (36.8 C), temperature source Oral, resp. rate 16, height 5' 2"$  (1.575 m), weight 74.8 kg, last menstrual period 05/11/2022, SpO2 100 %, unknown if currently breastfeeding.  Recent Labs    01/17/23 1003  HGB 11.2*  HCT 35.6*    Assessment/Plan: --Returned to bedside for boggy fundus and passing of moderate clots, not improved s/p Methergine and Pitocin bolus --Dr. Mikel Cella requested at bedside for assessment --Fundus firm, bleeding negligible after manual removal of multiple clots containing placental tissue performed by Dr. Mikel Cella --VSS, procedure tolerated well by patient --Recovering appropriately when providers left room --Continue to monitor   LOS: 0 days   Darlina Rumpf, CNM 01/17/2023, 6:15 PM

## 2023-01-17 NOTE — Discharge Summary (Signed)
Postpartum Discharge Summary  Date of Service updated***     Patient Name: Alexandria [name] [facility] DOB: [birth year] MRN: [id number]  Date of admission: 01/17/2023 Delivery date:01/17/2023  Delivering provider: Conan Bowens  Date of discharge: 01/17/2023  Admitting diagnosis: Indication for care in labor or delivery [O75.9] Intrauterine pregnancy: [redacted]w[redacted]d    Secondary diagnosis:  Active Problems:   Indication for care in labor or delivery   Late prenatal care affecting pregnancy in second trimester  Additional problems: ***    Discharge diagnosis: Term Pregnancy Delivered                                              Post partum procedures: N/A Augmentation: AROM forebag, 2 milliunits Pitocin Complications: None  Hospital course: Onset of Labor With Vaginal Delivery      27y.o. yo [phone] 311w3das admitted in Active Labor on 01/17/2023. Labor course was uncomplicated  Membrane Rupture Time/Date: 9:30 AM ,01/17/2023   Delivery Method:Vaginal, Spontaneous  Episiotomy: None  Lacerations:    Patient had a postpartum course complicated by ***.  She is ambulating, tolerating a regular diet, passing flatus, and urinating well. Patient is discharged home in stable condition on 01/17/23.  Newborn Data: Birth date:01/17/2023  Birth time:4:28 PM  Gender:Female  Living status:Living  Apgars:10 ,10  Weight:   Magnesium Sulfate received: No BMZ received: No Rhophylac:N/A MMR:N/A T-DaP: declined in current pregnancy, received in previous pregnancy 03/22/2022 Flu: No Transfusion:No  Physical exam  Vitals:   01/17/23 1604 01/17/23 1609 01/17/23 1614 01/17/23 1619  BP:      Pulse:      Resp:      Temp:      TempSrc:      SpO2: 99% 100% 99% 100%  Weight:      Height:       General: {Exam; general:21111117} Lochia: {Desc; appropriate/inappropriate:30686::"appropriate"} Uterine Fundus: {Desc; firm/soft:30687} Incision: {Exam; incision:21111123} DVT Evaluation:  {Exam; dvt:2111122} Labs: Lab Results  Component Value Date   WBC 9.1 01/17/2023   HGB 11.2 (L) 01/17/2023   HCT 35.6 (L) 01/17/2023   MCV 86.2 01/17/2023   PLT 169 01/17/2023       No data to display         Edinburgh Score:    03/22/2022    1:19 PM  Edinburgh Postnatal Depression Scale Screening Tool  I have been able to laugh and see the funny side of things. 0  I have looked forward with enjoyment to things. 0  I have blamed myself unnecessarily when things went wrong. 0  I have been anxious or worried for no good reason. 0  I have felt scared or panicky for no good reason. 0  Things have been getting on top of me. 0  I have been so unhappy that I have had difficulty sleeping. 0  I have felt sad or miserable. 0  I have been so unhappy that I have been crying. 0  The thought of harming myself has occurred to me. 0  Edinburgh Postnatal Depression Scale Total 0     After visit meds:  Allergies as of 01/17/2023   No Known Allergies   Med Rec must be completed prior to using this [facility]        Discharge home in stable condition Infant Feeding: Bottle and Breast Infant Disposition:home with  mother Discharge instruction: per After Visit Summary and Postpartum booklet. Activity: Advance as tolerated. Pelvic rest for 6 weeks.  Diet: routine diet Future Appointments: Future Appointments  Date Time Provider [place]  01/21/2023 10:15 AM Gerlene Fee, DO [facility]  01/28/2023  1:15 PM Tresea Mall, CNM [facility]  02/03/2023  1:15 PM Stormy Card, MD [facility]  02/03/2023  2:15 PM [facility]   Follow up Visit: Message sent by Maryelizabeth Kaufmann, CNM on 01/17/2023  Please schedule this patient for a In person postpartum visit in 4 weeks with the following provider: APP. Additional Postpartum F/U: N/A   Low risk pregnancy complicated by:  short interval pregnancy Delivery mode:  Vaginal, Spontaneous  Anticipated Birth  Control:  Depo   01/17/2023 Darlina Rumpf, CNM

## 2023-01-17 NOTE — Anesthesia Preprocedure Evaluation (Signed)
Anesthesia Evaluation  Patient identified by MRN, date of birth, ID band Patient awake    Reviewed: Allergy & Precautions, Patient's Chart, lab work & pertinent test results  Airway Mallampati: II  TM Distance: >3 FB Neck ROM: Full    Dental no notable dental hx.    Pulmonary neg pulmonary ROS   Pulmonary exam normal breath sounds clear to auscultation       Cardiovascular negative cardio ROS Normal cardiovascular exam Rhythm:Regular Rate:Normal     Neuro/Psych negative neurological ROS  negative psych ROS   GI/Hepatic negative GI ROS, Neg liver ROS,,,  Endo/Other  negative endocrine ROS    Renal/GU negative Renal ROS  negative genitourinary   Musculoskeletal negative musculoskeletal ROS (+)    Abdominal   Peds negative pediatric ROS (+)  Hematology  (+) Blood dyscrasia, anemia Hb 11.2, plt 169   Anesthesia Other Findings   Reproductive/Obstetrics (+) Pregnancy                             Anesthesia Physical Anesthesia Plan  ASA: 2  Anesthesia Plan: Epidural   Post-op Pain Management:    Induction:   PONV Risk Score and Plan: 2  Airway Management Planned: Natural Airway  Additional Equipment: None  Intra-op Plan:   Post-operative Plan:   Informed Consent: I have reviewed the patients History and Physical, chart, labs and discussed the procedure including the risks, benefits and alternatives for the proposed anesthesia with the patient or authorized representative who has indicated his/her understanding and acceptance.       Plan Discussed with:   Anesthesia Plan Comments:        Anesthesia Quick Evaluation

## 2023-01-17 NOTE — H&P (Addendum)
OBSTETRIC ADMISSION HISTORY AND PHYSICAL  Alexandria [name] is a 27 y.o. female [phone] with IUP at 75w3dby LMP presenting for SOL w/ SROM. She reports +FMs, No LOF, no VB, no blurry vision, headaches or peripheral edema, and RUQ pain.  She plans on both breast and formula feeding. She request Depo for birth control. [place] --->  Estimated Date of Delivery: 01/28/23  Sono:    @[redacted]w[redacted]d$ , CWD, normal anatomy, cephalic presentation, Anterior fundal right, 3078 g, 46% EFW   Prenatal History/Complications: Late prenatal care, short interval pregnancy   Past Medical History: Past Medical History:  Diagnosis Date   Medical history non-contributory     Past Surgical History: Past Surgical History:  Procedure Laterality Date   NO PAST SURGERIES      Obstetrical History: OB History     Gravida  4   Para  3   Term  3   Preterm  0   AB  0   Living  3      SAB  0   IAB  0   Ectopic  0   Multiple  0   Live Births  3           Social History Social History   Socioeconomic History   Marital status: Single    Spouse name: Pablo   Number of children: 3   Years of education: Not on file   Highest education level: 12th grade  Occupational History   Not on file  Tobacco Use   Smoking status: Never   Smokeless tobacco: Never  Vaping Use   Vaping Use: Never used  Substance and Sexual Activity   Alcohol use: No   Drug use: No   Sexual activity: Yes    Birth control/protection: None  Other Topics Concern   Not on file  Social History Narrative   Not on file   Social Determinants of Health   Financial Resource Strain: Low Risk  (11/29/2022)   Overall Financial Resource Strain (CARDIA)    Difficulty of Paying Living Expenses: Not very hard  Food Insecurity: No Food Insecurity (11/29/2022)   Hunger Vital Sign    Worried About Running Out of Food in the Last Year: Never true    Ran Out of Food  in the Last Year: Never true  Recent Concern: Food Insecurity - Food Insecurity Present (11/06/2022)   Hunger Vital Sign    Worried About Running Out of Food in the Last Year: Sometimes true    Ran Out of Food in the Last Year: Never true  Transportation Needs: No Transportation Needs (11/29/2022)   PRAPARE - THydrologist(Medical): No    Lack of Transportation (Non-Medical): No  Physical Activity: Inactive (11/29/2022)   Exercise Vital Sign    Days of Exercise per Week: 1 day    Minutes of Exercise per Session: 0 min  Stress: No Stress Concern Present (11/29/2022)   [place]   Feeling of Stress : Only a little  Social Connections: Unknown (11/29/2022)   Social Connection and Isolation Panel [NHANES]    Frequency of Communication with Friends and Family: Patient refused    Frequency of Social Gatherings with Friends and Family: Once a week    Attends Religious Services: Never    AMarine scientistor Organizations: No    Attends  Music therapist: Not on file    Marital Status: Patient refused    Family History: Family History  Problem Relation Age of Onset   Hypertension Maternal Grandmother    Diabetes Maternal Grandmother    Asthma Neg Hx    Heart disease Neg Hx    Stroke Neg Hx     Allergies: No Known Allergies  Medications Prior to Admission  Medication Sig Dispense Refill Last Dose   ferrous sulfate 325 (65 FE) MG tablet Take 325 mg by mouth daily with breakfast.   01/16/2023   Prenatal Vit-Fe Fumarate-FA (PRENATAL MULTIVITAMIN) TABS tablet Take 1 tablet by mouth daily at 12 noon.   01/16/2023   acetaminophen (TYLENOL) 325 MG tablet Take 2 tablets (650 mg total) by mouth every 4 (four) hours as needed (for pain scale < 4). (Patient not taking: Reported on 12/13/2022) 30 tablet 0    ondansetron (ZOFRAN-ODT) 4 MG disintegrating tablet Take 1 tablet (4 mg total)  by mouth every 8 (eight) hours as needed for nausea or vomiting. (Patient not taking: Reported on 01/14/2023) 15 tablet 0    terconazole (TERAZOL 7) 0.4 % vaginal cream Place 1 applicator vaginally at bedtime. Use for seven days (Patient not taking: Reported on 12/13/2022) 45 g 0      Review of Systems   All systems reviewed and negative except as stated in HPI  Blood pressure (!) 108/56, pulse 91, temperature 97.7 F (36.5 C), temperature source Oral, resp. rate 20, height 5' 2"$  (1.575 m), weight 74.4 kg, last menstrual period 05/11/2022, SpO2 100 %, unknown if currently breastfeeding. General appearance: alert, cooperative, appears stated age, and no distress Lungs: clear to auscultation bilaterally Heart: regular rate and rhythm Abdomen: soft, non-tender; bowel sounds normal Extremities: Homans sign is negative, no sign of DVT Presentation: cephalic Fetal monitoringBaseline: 140 bpm, Variability: Good {> 6 bpm), Accelerations: Reactive, and Decelerations: Variable: mild Uterine activityFrequency: Every 2-3 minutes Dilation: 4.5 Effacement (%): 70 Station: -2 Exam by:: Andres Ege, RN   Prenatal labs: ABO, Rh: --/--/O POS (04/20 1714) Antibody: NEG (04/20 1714) Rubella: 1.00 (04/04 1906) RPR: Non Reactive (12/29 0904)  HBsAg: NON REACTIVE (04/04 1906)  HIV: Non Reactive (12/29 0904)  GBS:   PCR in work 1 hr Glucola not done A1C 5.4 Genetic screening  Declined Anatomy US Normal female  Prenatal Transfer Tool  Maternal Diabetes: No Genetic Screening: Declined Maternal Ultrasounds/Referrals: Normal Fetal Ultrasounds or other Referrals:  None Maternal Substance Abuse:  No Significant Maternal Medications:  None Significant Maternal Lab Results:  None Number of Prenatal Visits:greater than 3 verified prenatal visits Other Comments:  None  Results for orders placed or performed during the hospital encounter of 01/17/23 (from the past 24 hour(s))  [employer]  Time: 01/17/23  9:39 AM  Result Value Ref Range   POCT Fern Test Positive = ruptured amniotic membanes     Patient Active Problem List   Diagnosis Date Noted   Iron deficiency anemia during pregnancy 12/02/2022   Short interval between pregnancies affecting pregnancy, antepartum 11/06/2022   Supervision of low-risk pregnancy 11/05/2022    Assessment/Plan:  Alexandria [name] is a 27 y.o. [phone] at 21w3dhere forSOL w/ SROM  #Labor: Progressing spontaneously, consider Pitocin augmentation if no change in 6 hours #Pain: Per patient request  #FWB: Cat 2 in MAU, variables have resolved  #ID:  GBS unknown, PCR pending  #MOF: Both #MOC:Depo #Circ:  Undecided   SMallie Snooks MSA,  MSN, CNM Certified Nurse Midwife, Product/process development scientist for [employer], [place]

## 2023-01-18 LAB — CULTURE, BETA STREP (GROUP B ONLY): Strep Gp B Culture: NEGATIVE

## 2023-01-18 MED ORDER — METHYLERGONOVINE MALEATE 0.2 MG/ML IJ SOLN: 0.2000 mg | INTRAMUSCULAR | Status: DC | PRN

## 2023-01-18 MED ORDER — METHYLERGONOVINE MALEATE 0.2 MG PO TABS: 0.2000 mg | ORAL_TABLET | ORAL | Status: DC | PRN

## 2023-01-18 NOTE — Progress Notes (Signed)
POSTPARTUM PROGRESS NOTE  Post Partum Day 1  Subjective:  Alexandria [name] is a 27 y.o. [id number] s/p VD at [redacted]w[redacted]d  She reports she is doing well. No acute events overnight. She denies any problems with ambulating, voiding or po intake. Denies nausea or vomiting.  Pain is well controlled.  Lochia is adequate.  Objective: Blood pressure 93/64, pulse 94, temperature 97.8 F (36.6 C), temperature source Oral, resp. rate 18, height 5' 2"$  (1.575 m), weight 74.8 kg, last menstrual period 05/11/2022, SpO2 99 %, unknown if currently breastfeeding.  Physical Exam:  General: alert, cooperative and no distress Chest: no respiratory distress Heart:regular rate, distal pulses intact Uterine Fundus: firm, appropriately tender DVT Evaluation: No calf swelling or tenderness Extremities: no edema Skin: warm, dry  Recent Labs    01/17/23 1003  HGB 11.2*  HCT 35.6*    Assessment/Plan: Alexandria [name] a 27y.o. [id number]/p VD at 313w3d PPD#1 - Doing well  Routine postpartum care Contraception: Depo Feeding: Breast/bottle Dispo: Plan for discharge tomorrow.   LOS: 1 day   JeShelda PalDO OB Fellow  01/18/2023, 10:01 AM

## 2023-01-18 NOTE — Lactation Note (Addendum)
This note was copied from a baby's chart. Lactation Consultation Note  Patient Name: Alexandria [name] [id number] Date: 01/18/2023  Reason for consult: Initial assessment;Early term 37-38.6wks  Age:25 hours P4, 38.3 GA  Mother was resting in bed and baby sleeping upon arrival to room. Mother states "Alexandria [name]" is breastfeeding well and she does not have any concerns. According to the chart, baby has exclusively breast fed for 10-20 minutes. Latch scores have not been assessed.   Mother's infant feeding plan is to breast and formula feed. Formula was at bedside. Instructed to continue to breast feed baby with feeding cues, call for assist as needed, and if she starts formula, to breastfeed baby before giving formula.   OP LC handout given with information regarding support and services after discharge.   Maternal Data Has patient been taught Hand Expression?: No Does the patient have breastfeeding experience prior to this delivery?: Yes How long did the patient breastfeed?: breast fed for "30 days" with her other 3 children  Feeding Mother's Current Feeding Choice: Breast Milk and Formula  Interventions Interventions: Education;LC Services brochure  Discharge Pump: DEBP;Personal;Manual  Consult Status Consult Status: Follow-up Date: 01/19/23 Follow-up type: In-patient   Alexandria [name] M 01/18/2023, 11:00 AM

## 2023-01-18 NOTE — Lactation Note (Signed)
This note was copied from a baby's chart. Lactation Consultation Note  Patient Name: Alexandria [name] [id number] Date: 01/18/2023   Age:25 hours Per RN, Birth Parent is asleep currently, will ask Birth Parent at next assessment if she wants to be seen by [facility] services on [address].  Maternal Data    Feeding    LATCH Score                    Lactation Tools Discussed/Used    Interventions    Discharge    Consult Status      [name] 01/18/2023, 2:44 AM

## 2023-01-18 NOTE — Anesthesia Postprocedure Evaluation (Signed)
Anesthesia Post Note  Patient: Teacher, music Solorzano  Procedure(s) Performed: AN AD HOC LABOR EPIDURAL     Patient location during evaluation: Mother Baby Anesthesia Type: Epidural Level of consciousness: awake and alert Pain management: pain level controlled Vital Signs Assessment: post-procedure vital signs reviewed and stable Respiratory status: spontaneous breathing, nonlabored ventilation and respiratory function stable Cardiovascular status: stable Postop Assessment: no headache, no backache, epidural receding and able to ambulate Anesthetic complications: no   No notable events documented.  Last Vitals:  Vitals:   01/18/23 0545 01/18/23 0800  BP: (!) 99/59 93/64  Pulse: 78 94  Resp: 16 18  Temp: 36.8 C 36.6 C  SpO2: 97% 99%    Last Pain:  Vitals:   01/18/23 0900  TempSrc:   PainSc: 0-No pain   Pain Goal: Patients Stated Pain Goal: 3 (01/18/23 0800)                 Guillermo Nehring

## 2023-01-19 MED ORDER — IBUPROFEN 600 MG PO TABS: 600.0000 mg | ORAL_TABLET | Freq: Four times a day (QID) | ORAL | 1 refills | Status: DC

## 2023-01-19 NOTE — Lactation Note (Signed)
This note was copied from a baby's chart. Lactation Consultation Note  Patient Name: Alexandria [name] [id number] Date: 01/19/2023   Age:24 hours P4, 38.3 GA  Mother has been formula feeding and infant taking 60 ml.    Feeding Nipple Type: Slow - flow   Consult Status Consult Status: Complete Date: 01/19/23   [name] 01/19/2023, 10:28 AM

## 2023-01-20 ENCOUNTER — Encounter: Payer: Self-pay | Admitting: Certified Nurse Midwife

## 2023-01-20 NOTE — Progress Notes (Signed)
Alert received through NCNotify system as follows: Patient identified as not having a recommended prenatal visit. Patient is [redacted] weeks pregnant. Most recent prenatal visit was on 12/27/2022 at [place].  Chart review: Patient's last visit was on 01/14/2023. Pt delivered on 01/17/2023.  No action needed.  Martina Sinner, RN 01/20/2023  1:52 PM

## 2023-01-21 ENCOUNTER — Encounter: Payer: Self-pay | Admitting: Family Medicine

## 2023-01-23 ENCOUNTER — Telehealth: Payer: Self-pay | Admitting: Pharmacy Technician

## 2023-01-23 NOTE — Telephone Encounter (Addendum)
PAP - Venofer (free drug)  Alexandria [name],  Patient will need Venofer free drug program due to patient is currently un-inusred. Spoke with patient and new insurance is currently pending. Please provide me with a fax number to send patient assistance forms for your signature.  Maudie Mercury

## 2023-01-24 NOTE — Telephone Encounter (Signed)
Fax has been sent. Please complete Healthcare Provider Section (including NPI, etc) on the Patient Enrollment form and also please sign the Product Request Form and return to me as soon as possible. Thanks Maudie Mercury

## 2023-01-28 ENCOUNTER — Telehealth (HOSPITAL_COMMUNITY): Payer: Self-pay | Admitting: *Deleted

## 2023-01-28 ENCOUNTER — Encounter: Payer: Self-pay | Admitting: Certified Nurse Midwife

## 2023-01-28 ENCOUNTER — Encounter: Payer: Self-pay | Admitting: Advanced Practice Midwife

## 2023-01-28 NOTE — Telephone Encounter (Signed)
Hospital Discharge Follow-Up Call:  Patient reports that she is well except for a rash that she says is over her entire body.  Encouraged her to call OB office to have that checked.  EPDS today was 2 and she endorses this accurately describes that she is doing well emotionally.  Patient says that baby is well and she has no concerns about baby's health.  She reports that baby sleeps in a crib.  Reviewed ABCs of Safe Sleep.

## 2023-01-28 NOTE — Telephone Encounter (Signed)
Jamilla, Wanted to f/u on PAP forms. I have not received them yet. Can you please re-fax [phone] Maudie Mercury

## 2023-01-31 ENCOUNTER — Encounter: Payer: Self-pay | Admitting: Certified Nurse Midwife

## 2023-02-03 ENCOUNTER — Encounter: Payer: Self-pay | Admitting: Family Medicine

## 2023-02-03 ENCOUNTER — Other Ambulatory Visit: Payer: Self-pay

## 2023-02-04 NOTE — Telephone Encounter (Signed)
Left v/m with Center for [facility]. - awaiting PAP forms Phone: [phone] 3rd attempt.

## 2023-02-07 NOTE — Telephone Encounter (Signed)
I will refax the forms.  Is [phone] the correct fax??

## 2023-02-11 NOTE — Telephone Encounter (Signed)
Samella Parr, Fax has been re-sent.  Have you received it yet?

## 2023-02-14 ENCOUNTER — Other Ambulatory Visit: Payer: Self-pay | Admitting: Pharmacy Technician

## 2023-02-14 ENCOUNTER — Encounter: Payer: Self-pay | Admitting: Certified Nurse Midwife

## 2023-02-14 ENCOUNTER — Telehealth: Payer: Self-pay

## 2023-02-14 NOTE — Telephone Encounter (Addendum)
Patient has been approved for Venofer PAP (free drug)  and will be scheduled as soon as possible.  Auth Submission: APPROVED Site of care: Site of care: [facility] Payer: [place] PAP- VENOFER FREE DRUG Medication & CPT/J Code(s) submitted: Venofer (Iron Sucrose) J1756 Route of submission (phone, fax, portal):  Phone [phone] Fax [phone] Auth type: PAP Units/visits requested: 5 Reference number: [id number] Approval from: 01/31/23 to 01/31/24    Patient will be scheduled as soon as possible

## 2023-02-17 ENCOUNTER — Other Ambulatory Visit: Payer: Self-pay | Admitting: Advanced Practice Midwife

## 2023-02-17 ENCOUNTER — Ambulatory Visit (INDEPENDENT_AMBULATORY_CARE_PROVIDER_SITE_OTHER): Payer: Self-pay

## 2023-02-17 VITALS — BP 96/66 | HR 88 | Temp 99.3°F | Resp 18 | Ht 62.0 in | Wt 154.6 lb

## 2023-02-17 DIAGNOSIS — O9081 Anemia of the puerperium: Secondary | ICD-10-CM

## 2023-02-17 DIAGNOSIS — D509 Iron deficiency anemia, unspecified: Secondary | ICD-10-CM

## 2023-02-17 MED ORDER — DIPHENHYDRAMINE HCL 25 MG PO CAPS
25.0000 mg | ORAL_CAPSULE | Freq: Once | ORAL | Status: AC
  Administered 2023-02-17: 25 mg via ORAL
  Filled 2023-02-17: qty 1

## 2023-02-17 MED ORDER — ACETAMINOPHEN 325 MG PO TABS
650.0000 mg | ORAL_TABLET | Freq: Once | ORAL | Status: AC
  Administered 2023-02-17: 650 mg via ORAL
  Filled 2023-02-17: qty 2

## 2023-02-17 MED ORDER — SODIUM CHLORIDE 0.9 % IV SOLN
200.0000 mg | Freq: Once | INTRAVENOUS | Status: AC
  Administered 2023-02-17: 200 mg via INTRAVENOUS
  Filled 2023-02-17: qty 10

## 2023-02-17 NOTE — Progress Notes (Signed)
Diagnosis: Iron Deficiency Anemia  Provider:  Marshell Garfinkel MD  Procedure: Infusion  IV Type: Peripheral, IV Location: L Antecubital  Venofer (Iron Sucrose), Dose: 200 mg  Infusion Start Time: [id number]  Infusion Stop Time: [id number]  Post Infusion IV Care: Observation period completed and Peripheral IV Discontinued  Discharge: Condition: Good, Destination: Home . AVS Provided  Performed by:  Arnoldo Morale, RN

## 2023-02-18 ENCOUNTER — Other Ambulatory Visit (HOSPITAL_COMMUNITY): Payer: Self-pay

## 2023-02-19 ENCOUNTER — Ambulatory Visit (INDEPENDENT_AMBULATORY_CARE_PROVIDER_SITE_OTHER): Payer: Self-pay

## 2023-02-19 ENCOUNTER — Encounter: Payer: Self-pay | Admitting: Certified Nurse Midwife

## 2023-02-19 VITALS — BP 99/66 | HR 84 | Temp 98.2°F | Resp 16 | Ht 62.0 in | Wt 156.1 lb

## 2023-02-19 DIAGNOSIS — D509 Iron deficiency anemia, unspecified: Secondary | ICD-10-CM

## 2023-02-19 DIAGNOSIS — O9081 Anemia of the puerperium: Secondary | ICD-10-CM

## 2023-02-19 MED ORDER — DIPHENHYDRAMINE HCL 25 MG PO CAPS: 25.0000 mg | ORAL_CAPSULE | Freq: Once | ORAL | Status: DC

## 2023-02-19 MED ORDER — SODIUM CHLORIDE 0.9 % IV SOLN
200.0000 mg | Freq: Once | INTRAVENOUS | Status: AC
  Administered 2023-02-19: 200 mg via INTRAVENOUS
  Filled 2023-02-19: qty 10

## 2023-02-19 MED ORDER — ACETAMINOPHEN 325 MG PO TABS: 650.0000 mg | ORAL_TABLET | Freq: Once | ORAL | Status: DC

## 2023-02-19 NOTE — Progress Notes (Signed)
Diagnosis: Iron Deficiency Anemia  Provider:  Marshell Garfinkel MD  Procedure: Infusion  IV Type: Peripheral, IV Location: R Antecubital  Venofer (Iron Sucrose), Dose: 200 mg  Infusion Start Time: [id number]  Infusion Stop Time: [id number]  Post Infusion IV Care: Peripheral IV Discontinued  Discharge: Condition: Good, Destination: Home . AVS Declined  Performed by:  Fraser Din [place], RN

## 2023-02-19 NOTE — Telephone Encounter (Signed)
Call to schedule. 

## 2023-02-21 ENCOUNTER — Ambulatory Visit (INDEPENDENT_AMBULATORY_CARE_PROVIDER_SITE_OTHER): Payer: Self-pay

## 2023-02-21 VITALS — BP 101/61 | HR 78 | Temp 98.3°F | Resp 18 | Ht 62.0 in | Wt 153.8 lb

## 2023-02-21 DIAGNOSIS — D509 Iron deficiency anemia, unspecified: Secondary | ICD-10-CM

## 2023-02-21 DIAGNOSIS — O9081 Anemia of the puerperium: Secondary | ICD-10-CM

## 2023-02-21 MED ORDER — DIPHENHYDRAMINE HCL 25 MG PO CAPS: 25.0000 mg | ORAL_CAPSULE | Freq: Once | ORAL | Status: DC

## 2023-02-21 MED ORDER — SODIUM CHLORIDE 0.9 % IV SOLN
200.0000 mg | Freq: Once | INTRAVENOUS | Status: AC
  Administered 2023-02-21: 200 mg via INTRAVENOUS
  Filled 2023-02-21: qty 10

## 2023-02-21 MED ORDER — ACETAMINOPHEN 325 MG PO TABS: 650.0000 mg | ORAL_TABLET | Freq: Once | ORAL | Status: DC

## 2023-02-21 NOTE — Progress Notes (Signed)
Diagnosis: Iron Deficiency Anemia  Provider:  Marshell Garfinkel MD  Procedure: Infusion  IV Type: Peripheral, IV Location: L Antecubital  Venofer (Iron Sucrose), Dose: 200 mg  Infusion Start Time: [id number]  Infusion Stop Time: [id number]  Post Infusion IV Care: Peripheral IV Discontinued  Discharge: Condition: Good, Destination: Home . AVS Declined  Performed by:  Cleophus Molt, RN

## 2023-02-24 ENCOUNTER — Ambulatory Visit: Payer: Self-pay

## 2023-02-24 MED ORDER — ACETAMINOPHEN 325 MG PO TABS: 650.0000 mg | ORAL_TABLET | Freq: Once | ORAL | Status: DC

## 2023-02-24 MED ORDER — DIPHENHYDRAMINE HCL 25 MG PO CAPS: 25.0000 mg | ORAL_CAPSULE | Freq: Once | ORAL | Status: DC

## 2023-02-24 MED ORDER — SODIUM CHLORIDE 0.9 % IV SOLN
200.0000 mg | Freq: Once | INTRAVENOUS | Status: DC
  Filled 2023-02-24: qty 10

## 2023-02-25 ENCOUNTER — Encounter: Payer: Self-pay | Admitting: Certified Nurse Midwife

## 2023-02-26 ENCOUNTER — Telehealth: Payer: Self-pay

## 2023-02-26 ENCOUNTER — Ambulatory Visit: Payer: Self-pay | Admitting: Certified Nurse Midwife

## 2023-02-26 ENCOUNTER — Ambulatory Visit: Payer: Self-pay

## 2023-02-26 MED ORDER — SODIUM CHLORIDE 0.9 % IV SOLN
200.0000 mg | Freq: Once | INTRAVENOUS | Status: DC
  Filled 2023-02-26: qty 10

## 2023-02-26 MED ORDER — DIPHENHYDRAMINE HCL 25 MG PO CAPS: 25.0000 mg | ORAL_CAPSULE | Freq: Once | ORAL | Status: DC

## 2023-02-26 MED ORDER — ACETAMINOPHEN 325 MG PO TABS: 650.0000 mg | ORAL_TABLET | Freq: Once | ORAL | Status: DC

## 2023-02-26 NOTE — Progress Notes (Signed)
No show

## 2023-02-26 NOTE — Telephone Encounter (Signed)
Called pt to follow up on missed PP visit. VM left with callback number to reschedule either in person or virtual.

## 2023-02-26 NOTE — Progress Notes (Signed)
Called but did not answer or come to visit.

## 2023-02-27 ENCOUNTER — Encounter: Payer: Self-pay | Admitting: Certified Nurse Midwife

## 2023-02-28 ENCOUNTER — Telehealth (HOSPITAL_COMMUNITY): Payer: Self-pay

## 2023-02-28 NOTE — Telephone Encounter (Signed)
Chart review.

## 2023-12-03 NOTE — L&D Delivery Note (Signed)
 OB/GYN Faculty Practice Delivery Note  [facility] Alexandria [name] is a 28 y.o. [id number] s/p SVD at [redacted]w[redacted]d. She was admitted for spontaneous onset of labor.   ROM: 2h 67m with clear fluid GBS Status: negative Maximum Maternal Temperature: 98.34F  Labor Progress: Initial SVE 7cm, progressed with AROM and pitocin  to fuly dilated/+1  Delivery Date/Time: 09/15/2024 1619 Delivery: Called to room and patient was complete and pushing. Head delivered LOA. No nuchal cord present. Shoulder and body delivered in usual fashion. Infant with spontaneous cry, placed on mother's abdomen, dried and stimulated. Cord clamped x 2 after 1-minute delay, and cut by provider. Cord blood drawn. Placenta delivered spontaneously, intact, with 3-vessel cord. Fundus firm with massage and Pitocin  but patient continued to pass clots. 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 Labia, perineum, vagina, and cervix inspected, 2nd degree perineal laceration found and repaired with 3-0 Vicryl.   Placenta: Intact, delivered spontaneously Complications: None Lacerations: 2nd degree perineal EBL: Analgesia: Epidural  Infant: Viable female  APGARs 9,9  3430g  [name], MD 09/15/2024, 4:55 PM

## 2024-03-16 ENCOUNTER — Ambulatory Visit: Payer: Self-pay

## 2024-03-16 DIAGNOSIS — Z3201 Encounter for pregnancy test, result positive: Secondary | ICD-10-CM

## 2024-03-16 DIAGNOSIS — Z3200 Encounter for pregnancy test, result unknown: Secondary | ICD-10-CM

## 2024-03-16 LAB — POCT PREGNANCY, URINE: Preg Test, Ur: POSITIVE — AB

## 2024-03-16 NOTE — Progress Notes (Signed)
 Possible Pregnancy  Patient dropped off urine today for pregnancy confirmation. UPT in office today is positive. Pt reports first positive home UPT approximately 1.5 months ago. Reviewed dating with patient:   LMP: 12/27/23 Approximate  EDD: 10/02/24 11w 3d today  OB history reviewed. Reviewed medications and allergies with patient; list of medications safe to take during pregnancy given.  Recommended pt begin prenatal vitamin and schedule prenatal care. Patient reports she is taking over the counter prenatal vitamins and would like to begin prenatal care in our office. Patient scheduled for dating and viability US  to confirm EDD on 03/23/24 at 3:15 PM. Patient confirmed scheduled appointment. Front office to schedule patient for new OB intake and initial prenatal visit. Patient denies any vaginal bleeding or pain. Reviewed MAU precaution with patient. All questions answered and patient advised to contact our office for any questions or concerns.   Lennart Quitter, RN 03/16/2024  3:31 PM

## 2024-03-23 ENCOUNTER — Other Ambulatory Visit: Payer: Self-pay

## 2024-03-25 ENCOUNTER — Other Ambulatory Visit: Payer: Self-pay

## 2024-03-25 ENCOUNTER — Ambulatory Visit: Payer: Self-pay

## 2024-03-25 DIAGNOSIS — Z3491 Encounter for supervision of normal pregnancy, unspecified, first trimester: Secondary | ICD-10-CM

## 2024-03-25 DIAGNOSIS — Z3A14 14 weeks gestation of pregnancy: Secondary | ICD-10-CM

## 2024-03-25 DIAGNOSIS — Z3201 Encounter for pregnancy test, result positive: Secondary | ICD-10-CM

## 2024-04-08 ENCOUNTER — Telehealth: Payer: Self-pay | Admitting: General Practice

## 2024-04-08 DIAGNOSIS — Z3A16 16 weeks gestation of pregnancy: Secondary | ICD-10-CM

## 2024-04-08 DIAGNOSIS — Z3492 Encounter for supervision of normal pregnancy, unspecified, second trimester: Secondary | ICD-10-CM

## 2024-04-08 DIAGNOSIS — O09899 Supervision of other high risk pregnancies, unspecified trimester: Secondary | ICD-10-CM | POA: Insufficient documentation

## 2024-04-08 DIAGNOSIS — Z3490 Encounter for supervision of normal pregnancy, unspecified, unspecified trimester: Secondary | ICD-10-CM | POA: Insufficient documentation

## 2024-04-08 NOTE — Progress Notes (Signed)
 New OB Intake  I connected with Alexandria [name]  on 04/08/24 at  8:15 AM EDT by telephone Video Visit and verified that I am speaking with the correct person using two identifiers. Nurse is located at [facility] and pt is located at home.  I discussed the limitations, risks, security and privacy concerns of performing an evaluation and management service by telephone and the availability of in person appointments. I also discussed with the patient that there may be a patient responsible charge related to this service. The patient expressed understanding and agreed to proceed.  I explained I am completing New OB Intake today. We discussed EDD of 09/17/2024, by Ultrasound. Pt is [id number]. I reviewed her allergies, medications and Medical/Surgical/OB history.    Patient Active Problem List   Diagnosis Date Noted   Vaginal delivery 01/19/2023   Indication for care in labor or delivery 01/17/2023   Late prenatal care affecting pregnancy in second trimester 01/17/2023   Iron  deficiency anemia during pregnancy 12/02/2022   Short interval between pregnancies affecting pregnancy, antepartum 11/06/2022   Supervision of low-risk pregnancy 11/05/2022     Concerns addressed today none  Delivery Plans Plans to deliver at [facility]. Discussed the nature of our practice with multiple providers including residents and students. Due to the size of the practice, the delivering provider may not be the same as those providing prenatal care.   Patient is not interested in water birth.  MyChart/Babyscripts MyChart access verified. I explained pt will have some visits in office and some virtually. Babyscripts instructions given and order placed. Patient verifies receipt of registration text/e-mail. Account successfully created and app downloaded. If patient is a candidate for Optimized scheduling, add to sticky note.   Blood Pressure Cuff/Weight Scale Patient is self-pay; explained patient will be  given BP cuff at first prenatal appt. Explained after first prenatal appt pt will check weekly and document in Babyscripts. Patient does not have weight scale; patient may purchase if they desire to track weight weekly in Babyscripts.  Anatomy US  Explained first scheduled US  will be around 19 weeks. Anatomy US  scheduled for 6/13 at 2pm.  Is patient a CenteringPregnancy candidate?  Declined Declined due to appts are too long   Is patient a Mom+Baby Combined Care candidate?  Not a candidate   If accepted, confirm patient does not intend to move from the area for at least 12 months, then notify Mom+Baby staff  Is patient a candidate for Babyscripts Optimization? Yes, patient accepted    First visit review I reviewed new OB appt with patient. Explained pt will be seen by Dr Cresenzo at first visit. Discussed [place] genetic screening with patient. Patient desires Panorama- Horizon not indicated due to past testing. Routine prenatal labs to be drawn at new OB visit.   Last Pap Never had- will collect at new OB   Doria Garden, RN 04/08/2024  8:37 AM

## 2024-04-14 ENCOUNTER — Ambulatory Visit (INDEPENDENT_AMBULATORY_CARE_PROVIDER_SITE_OTHER): Payer: Self-pay | Admitting: Family Medicine

## 2024-04-14 ENCOUNTER — Other Ambulatory Visit: Payer: Self-pay

## 2024-04-14 VITALS — BP 109/67 | HR 97 | Wt 167.4 lb

## 2024-04-14 DIAGNOSIS — Z3490 Encounter for supervision of normal pregnancy, unspecified, unspecified trimester: Secondary | ICD-10-CM

## 2024-04-14 DIAGNOSIS — O09899 Supervision of other high risk pregnancies, unspecified trimester: Secondary | ICD-10-CM

## 2024-04-14 DIAGNOSIS — Z3A17 17 weeks gestation of pregnancy: Secondary | ICD-10-CM

## 2024-04-14 DIAGNOSIS — O09892 Supervision of other high risk pregnancies, second trimester: Secondary | ICD-10-CM

## 2024-04-14 NOTE — Progress Notes (Signed)
 Subjective:   Alexandria [name] is a 28 y.o. [id number] at [redacted]w[redacted]d by LMP being seen today for her first obstetrical visit.  Her obstetrical history is significant for short interval pregnancy. Patient does intend to breast feed. Pregnancy history fully reviewed.  Patient reports no bleeding, no contractions, no cramping, and no leaking.  HISTORY: OB History  Gravida Para Term Preterm AB Living  5 4 4  0 0 4  SAB IAB Ectopic Multiple Live Births  0 0 0 0 4    # Outcome Date GA Lbr Len/2nd Weight Sex Type Anes PTL Lv  5 Current           4 Term 01/17/23 [redacted]w[redacted]d 06:52 / 00:06 7 lb 7.6 oz (3.39 kg) M Vag-Spont EPI  LIV     Name: KAVYA, MCADOW Dominiqua     Apgar1: 10  Apgar5: 10  3 Term 03/21/22 [redacted]w[redacted]d 08:01 / 00:11 7 lb 5.8 oz (3.34 kg) F Vag-Spont EPI  LIV     Name: ISIOMA, BUGGS Maezie     Apgar1: 9  Apgar5: 9  2 Term 08/26/18 [redacted]w[redacted]d 05:29 / 00:10 6 lb 12.3 oz (3.07 kg) F Vag-Spont EPI  LIV     Birth Comments: WNL     Name: ATALAYA, KLIMENT Lova     Apgar1: 8  Apgar5: 9  1 Term 10/08/17 [redacted]w[redacted]d 11:50 / 01:59 6 lb 14.8 oz (3.141 kg) F Vag-Spont EPI  LIV     Name: Mcferran,GIRL Kiante     Apgar1: 9  Apgar5: 9   Last pap smear was never done   Past Medical History:  Diagnosis Date   Medical history non-contributory    Past Surgical History:  Procedure Laterality Date   NO PAST SURGERIES     Family History  Problem Relation Age of Onset   Hypertension Maternal Grandmother    Diabetes Maternal Grandmother    Asthma Neg Hx    Heart disease Neg Hx    Stroke Neg Hx    Social History   Tobacco Use   Smoking status: Never   Smokeless tobacco: Never  Vaping Use   Vaping status: Never Used  Substance Use Topics   Alcohol use: No   Drug use: No   No Known Allergies Current Outpatient Medications on File Prior to Visit  Medication Sig Dispense Refill   Prenatal Vit-Fe Fumarate-FA (PRENATAL MULTIVITAMIN) TABS tablet Take 1 tablet by mouth  daily.     No current facility-administered medications on file prior to visit.     Exam   Vitals:   04/14/24 0945  BP: 109/67  Pulse: 97  Weight: 167 lb 6.4 oz (75.9 kg)   Fetal Heart Rate (bpm): 159  System: General: well-developed, well-nourished female in no acute distress   Skin: normal coloration and turgor, no rashes   Neurologic: oriented, normal, negative, normal mood   Extremities: normal strength, tone, and muscle mass, ROM of all joints is normal   HEENT PERRLA, extraocular movement intact and sclera clear, anicteric   Mouth/Teeth mucous membranes moist, pharynx normal without lesions and dental hygiene good   Neck supple and no masses   Cardiovascular: regular rate and rhythm   Respiratory:  no respiratory distress, normal breath sounds   Abdomen: soft, non-tender; bowel sounds normal; no masses,  no organomegaly     Assessment:   Pregnancy: [id number] Patient Active Problem List   Diagnosis Date Noted   Supervision of low-risk pregnancy 04/08/2024   Short interval  between pregnancies affecting pregnancy, antepartum 04/08/2024     Plan:  1. [redacted] weeks gestation of pregnancy (Primary) FHR and BP appropriate today - [facility] AMB BABYSCRIPTS SCHEDULE OPTIMIZATION  Initial labs not drawn.  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  Will need initial labs at next visit Continue prenatal vitamins. Genetic Screening discussed, NIPS: Discussed, patient is going to fill out the application for assistance for nips. Ultrasound discussed; fetal anatomic survey: Previously scheduled. Problem list reviewed and updated. The nature of [facility] with multiple MDs and other Advanced Practice Providers was explained to patient; also emphasized that residents, students are part of our team. Routine obstetric precautions reviewed. No follow-ups on file.

## 2024-05-12 ENCOUNTER — Encounter: Payer: Self-pay | Admitting: Family Medicine

## 2024-05-14 ENCOUNTER — Ambulatory Visit: Payer: Self-pay

## 2024-05-14 ENCOUNTER — Other Ambulatory Visit: Payer: Self-pay

## 2024-06-24 ENCOUNTER — Other Ambulatory Visit (HOSPITAL_COMMUNITY)
Admission: RE | Admit: 2024-06-24 | Discharge: 2024-06-24 | Disposition: A | Discharge status: Home / Self Care | Payer: Self-pay | Source: Ambulatory Visit | Attending: Family Medicine | Admitting: Family Medicine

## 2024-06-24 ENCOUNTER — Ambulatory Visit: Payer: Self-pay | Admitting: Family Medicine

## 2024-06-24 ENCOUNTER — Other Ambulatory Visit: Payer: Self-pay

## 2024-06-24 VITALS — BP 112/78 | HR 98 | Wt 174.1 lb

## 2024-06-24 DIAGNOSIS — Z3A27 27 weeks gestation of pregnancy: Secondary | ICD-10-CM

## 2024-06-24 DIAGNOSIS — Z3490 Encounter for supervision of normal pregnancy, unspecified, unspecified trimester: Secondary | ICD-10-CM

## 2024-06-24 DIAGNOSIS — O09899 Supervision of other high risk pregnancies, unspecified trimester: Secondary | ICD-10-CM

## 2024-06-24 DIAGNOSIS — O09892 Supervision of other high risk pregnancies, second trimester: Secondary | ICD-10-CM

## 2024-06-24 NOTE — Progress Notes (Signed)
   PRENATAL VISIT NOTE  Subjective:  Alexandria [name] is a 28 y.o. H4E5995 at [redacted]w[redacted]d being seen today for ongoing prenatal care.  She is currently monitored for the following issues for this low-risk pregnancy and has Supervision of low-risk pregnancy and Short interval between pregnancies affecting pregnancy, antepartum on their problem list.  Patient doing well with no acute concerns today. She reports no complaints.  Contractions: Not present. Vag. Bleeding: None.  Movement: Present. Denies leaking of fluid.   The following portions of the patient's history were reviewed and updated as appropriate: allergies, current medications, past family history, past medical history, past social history, past surgical history and problem list. Problem list updated.  Objective:   Vitals:   06/24/24 1149  BP: 112/78  Pulse: 98  Weight: 174 lb 1.6 oz (79 kg)    Fetal Status: Fetal Heart Rate (bpm): 145 Fundal Height: 26 cm Movement: Present     General:  Alert, oriented and cooperative. Patient is in no acute distress.  Skin: Skin is warm and dry. No rash noted.   Cardiovascular: Normal heart rate noted  Respiratory: Normal respiratory effort, no problems with respiration noted  Abdomen: Soft, gravid, appropriate for gestational age.  Pain/Pressure: Present     Pelvic: Cervical exam deferred        Extremities: Normal range of motion.  Edema: None  Mental Status:  Normal mood and affect. Normal behavior. Normal judgment and thought content.   Assessment and Plan:  Pregnancy: G5P4004 at [redacted]w[redacted]d  1. Encounter for supervision of low-risk pregnancy, antepartum (Primary) - CBC/D/Plt+RPR+Rh+ABO+RubIgG... - Culture, OB Urine - Hemoglobin A1c - GC/Chlamydia probe amp ([place])not at [facility]  2. [redacted] weeks gestation of pregnancy  3. Short interval between pregnancies affecting pregnancy, antepartum - reschedule missed US    Preterm labor symptoms and general obstetric precautions  including but not limited to vaginal bleeding, contractions, leaking of fluid and fetal movement were reviewed in detail with the patient.  Please refer to After Visit Summary for other counseling recommendations.   Return in about 2 weeks (around 07/08/2024) for LOB .   Augustin Slade, MD Family Medicine - Obstetrics Fellow

## 2024-06-25 LAB — CBC/D/PLT+RPR+RH+ABO+RUBIGG...
Antibody Screen: NEGATIVE
Basophils Absolute: 0.1 x10E3/uL (ref 0.0–0.2)
Basos: 1 %
EOS (ABSOLUTE): 0.1 x10E3/uL (ref 0.0–0.4)
Eos: 1 %
HCV Ab: NONREACTIVE
HIV Screen 4th Generation wRfx: NONREACTIVE
Hematocrit: 37.4 % (ref 34.0–46.6)
Hemoglobin: 12.2 g/dL (ref 11.1–15.9)
Hepatitis B Surface Ag: NEGATIVE
Immature Grans (Abs): 0 x10E3/uL (ref 0.0–0.1)
Immature Granulocytes: 0 %
Lymphocytes Absolute: 2.2 x10E3/uL (ref 0.7–3.1)
Lymphs: 25 %
MCH: 30.3 pg (ref 26.6–33.0)
MCHC: 32.6 g/dL (ref 31.5–35.7)
MCV: 93 fL (ref 79–97)
Monocytes Absolute: 0.5 x10E3/uL (ref 0.1–0.9)
Monocytes: 5 %
Neutrophils Absolute: 5.7 x10E3/uL (ref 1.4–7.0)
Neutrophils: 67 %
Platelets: 208 x10E3/uL (ref 150–450)
RBC: 4.03 x10E6/uL (ref 3.77–5.28)
RDW: 13.5 % (ref 11.7–15.4)
RPR Ser Ql: NONREACTIVE
Rh Factor: POSITIVE
Rubella Antibodies, IGG: 1.06 {index} (ref >0.99)
WBC: 8.5 x10E3/uL (ref 3.4–10.8)

## 2024-06-25 LAB — HEMOGLOBIN A1C
Est. average glucose Bld gHb Est-mCnc: 100 mg/dL
Hgb A1c MFr Bld: 5.1 % (ref 4.8–5.6)

## 2024-06-25 LAB — HCV INTERPRETATION

## 2024-06-25 LAB — GC/CHLAMYDIA PROBE AMP (~~LOC~~) NOT AT ARMC
Chlamydia: NEGATIVE
Comment: NEGATIVE
Comment: NORMAL
Neisseria Gonorrhea: NEGATIVE

## 2024-06-26 LAB — URINE CULTURE, OB REFLEX

## 2024-06-26 LAB — CULTURE, OB URINE

## 2024-07-07 ENCOUNTER — Encounter: Payer: Self-pay | Admitting: Certified Nurse Midwife

## 2024-07-14 ENCOUNTER — Encounter: Payer: Self-pay | Admitting: Certified Nurse Midwife

## 2024-07-27 ENCOUNTER — Ambulatory Visit: Payer: Self-pay | Attending: Family Medicine

## 2024-07-27 ENCOUNTER — Other Ambulatory Visit: Payer: Self-pay | Admitting: Family Medicine

## 2024-07-27 ENCOUNTER — Ambulatory Visit (HOSPITAL_BASED_OUTPATIENT_CLINIC_OR_DEPARTMENT_OTHER): Payer: Self-pay | Admitting: Maternal & Fetal Medicine

## 2024-07-27 VITALS — BP 97/59 | HR 87

## 2024-07-27 DIAGNOSIS — Z363 Encounter for antenatal screening for malformations: Secondary | ICD-10-CM | POA: Insufficient documentation

## 2024-07-27 DIAGNOSIS — O09899 Supervision of other high risk pregnancies, unspecified trimester: Secondary | ICD-10-CM

## 2024-07-27 DIAGNOSIS — O09893 Supervision of other high risk pregnancies, third trimester: Secondary | ICD-10-CM | POA: Insufficient documentation

## 2024-07-27 DIAGNOSIS — Z3492 Encounter for supervision of normal pregnancy, unspecified, second trimester: Secondary | ICD-10-CM | POA: Insufficient documentation

## 2024-07-27 DIAGNOSIS — Z3A32 32 weeks gestation of pregnancy: Secondary | ICD-10-CM | POA: Insufficient documentation

## 2024-07-27 DIAGNOSIS — Z3493 Encounter for supervision of normal pregnancy, unspecified, third trimester: Secondary | ICD-10-CM | POA: Insufficient documentation

## 2024-07-27 DIAGNOSIS — O0933 Supervision of pregnancy with insufficient antenatal care, third trimester: Secondary | ICD-10-CM | POA: Insufficient documentation

## 2024-07-27 NOTE — Progress Notes (Signed)
 After review, MFM consult with provider is not indicated for today  Alexandria Nathanel Pipe, MD 07/27/2024 4:56 PM  [facility]

## 2024-08-25 ENCOUNTER — Ambulatory Visit (INDEPENDENT_AMBULATORY_CARE_PROVIDER_SITE_OTHER): Payer: Self-pay | Admitting: Obstetrics & Gynecology

## 2024-08-25 ENCOUNTER — Other Ambulatory Visit (HOSPITAL_COMMUNITY)
Admission: RE | Admit: 2024-08-25 | Discharge: 2024-08-25 | Disposition: A | Discharge status: Home / Self Care | Payer: Self-pay | Source: Ambulatory Visit | Attending: Obstetrics & Gynecology | Admitting: Obstetrics & Gynecology

## 2024-08-25 VITALS — BP 108/72 | HR 112 | Wt 178.0 lb

## 2024-08-25 DIAGNOSIS — Z3A36 36 weeks gestation of pregnancy: Secondary | ICD-10-CM

## 2024-08-25 DIAGNOSIS — Z3493 Encounter for supervision of normal pregnancy, unspecified, third trimester: Secondary | ICD-10-CM

## 2024-08-25 DIAGNOSIS — O09899 Supervision of other high risk pregnancies, unspecified trimester: Secondary | ICD-10-CM

## 2024-08-25 DIAGNOSIS — O09893 Supervision of other high risk pregnancies, third trimester: Secondary | ICD-10-CM

## 2024-08-25 NOTE — Progress Notes (Signed)
   PRENATAL VISIT NOTE  Subjective:  Alexandria [name] is a 28 y.o. H4E5995 at [redacted]w[redacted]d being seen today for ongoing prenatal care.  She is currently monitored for the following issues for this low-risk pregnancy and has Supervision of low-risk pregnancy and Short interval between pregnancies affecting pregnancy, antepartum on their problem list.  Patient reports occasional contractions.  Contractions: Not present. Vag. Bleeding: None.  Movement: Present. Denies leaking of fluid.   The following portions of the patient's history were reviewed and updated as appropriate: allergies, current medications, past family history, past medical history, past social history, past surgical history and problem list.   Objective:    Vitals:   08/25/24 1623  BP: 108/72  Pulse: (!) 112  Weight: 178 lb (80.7 kg)    Fetal Status:      Movement: Present    General: Alert, oriented and cooperative. Patient is in no acute distress.  Skin: Skin is warm and dry. No rash noted.   Cardiovascular: Normal heart rate noted  Respiratory: Normal respiratory effort, no problems with respiration noted  Abdomen: Soft, gravid, appropriate for gestational age.  Pain/Pressure: Absent     Pelvic: Cervical exam deferred        Extremities: Normal range of motion.  Edema: None  Mental Status: Normal mood and affect. Normal behavior. Normal judgment and thought content.   Assessment and Plan:  Pregnancy: G5P4004 at [redacted]w[redacted]d 1. [redacted] weeks gestation of pregnancy (Primary)  - GC/Chlamydia probe amp ([place])not at [facility] - Culture, beta strep (group b only) - CBC - HIV Antibody (routine testing w rflx) - RPR  2. Encounter for supervision of low-risk pregnancy in third trimester   3. Short interval between pregnancies affecting pregnancy, antepartum   Preterm labor symptoms and general obstetric precautions including but not limited to vaginal bleeding, contractions, leaking of fluid and fetal movement  were reviewed in detail with the patient. Please refer to After Visit Summary for other counseling recommendations.  Needs GDM screen Return in about 1 week (around 09/01/2024).  No future appointments.  Lynwood Solomons, MD

## 2024-08-26 LAB — GC/CHLAMYDIA PROBE AMP (~~LOC~~) NOT AT ARMC
Comment: NEGATIVE
Comment: NORMAL

## 2024-08-26 LAB — HIV ANTIBODY (ROUTINE TESTING W REFLEX): HIV Screen 4th Generation wRfx: NONREACTIVE

## 2024-08-26 LAB — CBC
Hematocrit: 35.1 % (ref 34.0–46.6)
Hemoglobin: 11.4 g/dL (ref 11.1–15.9)
MCH: 28.9 pg (ref 26.6–33.0)
MCHC: 32.5 g/dL (ref 31.5–35.7)
MCV: 89 fL (ref 79–97)
Platelets: 210 x10E3/uL (ref 150–450)
RBC: 3.94 x10E6/uL (ref 3.77–5.28)
RDW: 13.7 % (ref 11.7–15.4)
WBC: 8.4 x10E3/uL (ref 3.4–10.8)

## 2024-08-26 LAB — RPR: RPR Ser Ql: NONREACTIVE

## 2024-08-27 ENCOUNTER — Other Ambulatory Visit: Payer: Self-pay

## 2024-08-29 LAB — CULTURE, BETA STREP (GROUP B ONLY): Strep Gp B Culture: NEGATIVE

## 2024-09-01 ENCOUNTER — Ambulatory Visit (INDEPENDENT_AMBULATORY_CARE_PROVIDER_SITE_OTHER): Payer: Self-pay | Admitting: Family Medicine

## 2024-09-01 ENCOUNTER — Other Ambulatory Visit: Payer: Self-pay

## 2024-09-01 VITALS — BP 101/66 | HR 85 | Wt 177.0 lb

## 2024-09-01 DIAGNOSIS — O09899 Supervision of other high risk pregnancies, unspecified trimester: Secondary | ICD-10-CM

## 2024-09-01 DIAGNOSIS — Z3493 Encounter for supervision of normal pregnancy, unspecified, third trimester: Secondary | ICD-10-CM

## 2024-09-01 DIAGNOSIS — O09893 Supervision of other high risk pregnancies, third trimester: Secondary | ICD-10-CM

## 2024-09-01 DIAGNOSIS — Z3A37 37 weeks gestation of pregnancy: Secondary | ICD-10-CM | POA: Diagnosis not present

## 2024-09-01 NOTE — Progress Notes (Signed)
   PRENATAL VISIT NOTE  Subjective:  Alexandria [name] is a 28 y.o. H4E5995 at [redacted]w[redacted]d being seen today for ongoing prenatal care.  She is currently monitored for the following issues for this low-risk pregnancy and has Supervision of low-risk pregnancy and Short interval between pregnancies affecting pregnancy, antepartum on their problem list.  Patient reports no contractions, no cramping, and no leaking.  Contractions: Irritability. Vag. Bleeding: None.  Movement: Present. Denies leaking of fluid.   The following portions of the patient's history were reviewed and updated as appropriate: allergies, current medications, past family history, past medical history, past social history, past surgical history and problem list.   Objective:    Vitals:   09/01/24 1637  BP: 101/66  Pulse: 85  Weight: 177 lb (80.3 kg)    Fetal Status:  Fetal Heart Rate (bpm): 161   Movement: Present    General: Alert, oriented and cooperative. Patient is in no acute distress.  Skin: Skin is warm and dry. No rash noted.   Cardiovascular: Normal heart rate noted  Respiratory: Normal respiratory effort, no problems with respiration noted  Abdomen: Soft, gravid, appropriate for gestational age.  Pain/Pressure: Present (pressure)     Pelvic: Cervical exam performed in the presence of a chaperone      2/60/-2  Extremities: Normal range of motion.  Edema: None  Mental Status: Normal mood and affect. Normal behavior. Normal judgment and thought content.   Assessment and Plan:  Pregnancy: G5P4004 at [redacted]w[redacted]d 1. Encounter for supervision of low-risk pregnancy in third trimester (Primary) FHR BP appropriate today  2. Short interval between pregnancies affecting pregnancy, antepartum  3. [redacted] weeks gestation of pregnancy Swabs collected at previous visit, follow-up in 1 week  Term labor symptoms and general obstetric precautions including but not limited to vaginal bleeding, contractions, leaking of fluid  and fetal movement were reviewed in detail with the patient. Please refer to After Visit Summary for other counseling recommendations.   No follow-ups on file.  No future appointments.  Christan Ciccarelli V Jai Steil, MD

## 2024-09-04 ENCOUNTER — Telehealth: Payer: Self-pay | Admitting: Nurse Practitioner

## 2024-09-04 DIAGNOSIS — K649 Unspecified hemorrhoids: Secondary | ICD-10-CM

## 2024-09-04 NOTE — Patient Instructions (Signed)
  [name], thank you for joining Haze LELON Servant, NP for today's virtual visit.  While this provider is not your primary care provider (PCP), if your PCP is located in our provider database this encounter information will be shared with them immediately following your visit.   A [facility] MyChart account gives you access to today's visit and all your visits, tests, and labs performed at [facility]  click here if you don't have a [facility] MyChart account or go to [email or web address]  Consent: (Patient) Alexandria [name] provided verbal consent for this virtual visit at the beginning of the encounter.  Current Medications:  Current Outpatient Medications:    Prenatal Vit-Fe Fumarate-FA (PRENATAL MULTIVITAMIN) TABS tablet, Take 1 tablet by mouth daily., Disp: , Rfl:    Medications ordered in this encounter:  No orders of the defined types were placed in this encounter.    *If you need refills on other medications prior to your next appointment, please contact your pharmacy*  Follow-Up: Call back or seek an in-person evaluation if the symptoms worsen or if the condition fails to improve as anticipated.  [facility] [phone]  Other Instructions Needs to be seen in person for evaluation as she states all OTC efforts have failed and hemorrhoid is increasing in size.    If you have been instructed to have an in-person evaluation today at a local Urgent Care facility, please use the link below. It will take you to a list of all of our available [facility], including address, phone number and hours of operation. Please do not delay care.  [facility]  If you or a family member do not have a primary care provider, use the link below to schedule a visit and establish care. When you choose a [facility] primary care physician or advanced practice provider, you gain a long-term partner in  health. Find a Primary Care Provider  Learn more about [facility]'s in-office and virtual care options: [facility] - Get Care Now

## 2024-09-04 NOTE — Progress Notes (Signed)
 Virtual Visit Consent   Alexandria [name], you are scheduled for a virtual visit with a [facility] provider today. Just as with appointments in the office, your consent must be obtained to participate. Your consent will be active for this visit and any virtual visit you may have with one of our providers in the next 365 days. If you have a MyChart account, a copy of this consent can be sent to you electronically.  As this is a virtual visit, video technology does not allow for your provider to perform a traditional examination. This may limit your provider's ability to fully assess your condition. If your provider identifies any concerns that need to be evaluated in person or the need to arrange testing (such as labs, EKG, etc.), we will make arrangements to do so. Although advances in technology are sophisticated, we cannot ensure that it will always work on either your end or our end. If the connection with a video visit is poor, the visit may have to be switched to a telephone visit. With either a video or telephone visit, we are not always able to ensure that we have a secure connection.  By engaging in this virtual visit, you consent to the provision of healthcare and authorize for your insurance to be billed (if applicable) for the services provided during this visit. Depending on your insurance coverage, you may receive a charge related to this service.  I need to obtain your verbal consent now. Are you willing to proceed with your visit today? [place] Viviana Alvarado [name] has provided verbal consent on 09/04/2024 for a virtual visit (video or telephone). Haze LELON Servant, NP  Date: 09/04/2024 1:08 PM   Virtual Visit via Video Note   I, Haze LELON Servant, connected with  Jordana Dugue  ([id number], October 04, 1996) on 09/04/24 at  1:00 PM EDT by a video-enabled telemedicine application and verified that I am speaking with the correct person using two  identifiers.  Location: Patient: Virtual Visit Location Patient: Home Provider: Virtual Visit Location Provider: Home Office   I discussed the limitations of evaluation and management by telemedicine and the availability of in person appointments. The patient expressed understanding and agreed to proceed.    History of Present Illness: Alexandria [name] is a 28 y.o. who identifies as a female who was assigned female at birth, and is being seen today for Hemorrhoids at 38 weeks pregnancy.   Ms [name] has been experiencing painful hemorrhoids 3 days ago. She has been using hemorrhoid creams, wipes, coconut oil, witch hazel and sitz baths with no relief. She has been unable to sit or sleep due to pain.      Problems:  Patient Active Problem List   Diagnosis Date Noted   Supervision of low-risk pregnancy 04/08/2024   Short interval between pregnancies affecting pregnancy, antepartum 04/08/2024    Allergies: No Known Allergies Medications:  Current Outpatient Medications:    Prenatal Vit-Fe Fumarate-FA (PRENATAL MULTIVITAMIN) TABS tablet, Take 1 tablet by mouth daily., Disp: , Rfl:   Observations/Objective: Patient is well-developed, well-nourished in no acute distress.  Resting comfortably at home.  Head is normocephalic, atraumatic.  No labored breathing.  Speech is clear and coherent with logical content.  Patient is alert and oriented at baseline.    Assessment and Plan: 1. Hemorrhoids, unspecified hemorrhoid type (Primary) Needs to be seen in person for evaluation as she states all OTC efforts have failed and hemorrhoid is increasing in size.  Follow Up Instructions: I discussed the assessment and treatment plan with the patient. The patient was provided an opportunity to ask questions and all were answered. The patient agreed with the plan and demonstrated an understanding of the instructions.  A copy of instructions were sent to the patient via MyChart  unless otherwise noted below.     The patient was advised to call back or seek an in-person evaluation if the symptoms worsen or if the condition fails to improve as anticipated.    Plez Belton W Jacori Mulrooney, NP

## 2024-09-05 ENCOUNTER — Encounter (HOSPITAL_COMMUNITY): Payer: Self-pay

## 2024-09-05 ENCOUNTER — Emergency Department (HOSPITAL_COMMUNITY)
Admission: EM | Admit: 2024-09-05 | Discharge: 2024-09-05 | Disposition: A | Discharge status: Home / Self Care | Payer: Self-pay | Attending: Emergency Medicine | Admitting: Emergency Medicine

## 2024-09-05 ENCOUNTER — Ambulatory Visit (HOSPITAL_COMMUNITY)
Admission: EM | Admit: 2024-09-05 | Discharge: 2024-09-05 | Disposition: A | Discharge status: Home / Self Care | Payer: Self-pay

## 2024-09-05 ENCOUNTER — Other Ambulatory Visit: Payer: Self-pay

## 2024-09-05 DIAGNOSIS — K645 Perianal venous thrombosis: Secondary | ICD-10-CM

## 2024-09-05 DIAGNOSIS — K644 Residual hemorrhoidal skin tags: Secondary | ICD-10-CM

## 2024-09-05 DIAGNOSIS — O2243 Hemorrhoids in pregnancy, third trimester: Secondary | ICD-10-CM | POA: Insufficient documentation

## 2024-09-05 DIAGNOSIS — Z3A30 30 weeks gestation of pregnancy: Secondary | ICD-10-CM | POA: Insufficient documentation

## 2024-09-05 MED ORDER — LIDOCAINE 5 % EX OINT
1.0000 | TOPICAL_OINTMENT | Freq: Four times a day (QID) | CUTANEOUS | 0 refills | Status: DC | PRN
Start: 2024-09-05 — End: 2024-09-16

## 2024-09-05 MED ORDER — PROCTOFOAM HC 1-1 % EX FOAM: 1.0000 | Freq: Two times a day (BID) | CUTANEOUS | Status: AC

## 2024-09-05 MED ORDER — LIDOCAINE 4 % EX CREA
TOPICAL_CREAM | Freq: Once | CUTANEOUS | Status: AC
  Filled 2024-09-05 (×2): qty 5

## 2024-09-05 NOTE — Discharge Instructions (Addendum)
 Apply the lidocaine  topical medication to help with pain.  You can also apply aloe vera gel which can offer some relief.  Continue with sitz bath throughout the day to help with pain.  Follow-up with your OB/GYN doctor.  You can also consider seeing a general surgeon if your symptoms persist

## 2024-09-05 NOTE — ED Provider Notes (Signed)
 [facility]    CSN: [id number] Arrival date & time: 09/05/24  1148      History   Chief Complaint No chief complaint on file.   HPI Alexandria [name] is a 28 y.o. female.   Patient, that is [redacted] weeks pregnant, presents today due to 1 week worth of gradually enlarging hemorrhoid.  Patient states that it is causing her 8 out of 10 pain and has not been reduced with use of over-the-counter hemorrhoid remedies such as sitz bath, Preparation H cream, and Preparation H wipes.  Patient states that it is very uncomfortable to sit due to this hemorrhoid.     Past Medical History:  Diagnosis Date   Medical history non-contributory     Patient Active Problem List   Diagnosis Date Noted   Supervision of low-risk pregnancy 04/08/2024   Short interval between pregnancies affecting pregnancy, antepartum 04/08/2024    Past Surgical History:  Procedure Laterality Date   NO PAST SURGERIES      OB History     Gravida  5   Para  4   Term  4   Preterm  0   AB  0   Living  4      SAB  0   IAB  0   Ectopic  0   Multiple  0   Live Births  4            Home Medications    Prior to Admission medications   Medication Sig Start Date End Date Taking? Authorizing Provider  Prenatal Vit-Fe Fumarate-FA (PRENATAL MULTIVITAMIN) TABS tablet Take 1 tablet by mouth daily.   Yes [provider]    Family History Family History  Problem Relation Age of Onset   Hypertension Maternal Grandmother    Diabetes Maternal Grandmother    Asthma Neg Hx    Heart disease Neg Hx    Stroke Neg Hx     Social History Social History   Tobacco Use   Smoking status: Never   Smokeless tobacco: Never  Vaping Use   Vaping status: Never Used  Substance Use Topics   Alcohol use: No   Drug use: No     Allergies   Patient has no known allergies.   Review of Systems Review of Systems   Physical Exam Triage Vital Signs ED Triage Vitals  [09/05/24 1327]  Encounter Vitals Group     BP      Girls Systolic BP Percentile      Girls Diastolic BP Percentile      Boys Systolic BP Percentile      Boys Diastolic BP Percentile      Pulse Rate (!) 108     Resp 18     Temp 98.7 F (37.1 C)     Temp Source Oral     SpO2 97 %     Weight      Height      Head Circumference      Peak Flow      Pain Score      Pain Loc      Pain Education      Exclude from Growth Chart    No data found.  Updated Vital Signs Pulse (!) 108   Temp 98.7 F (37.1 C) (Oral)   Resp 18   LMP 12/27/2023   SpO2 97%   Visual Acuity Right Eye Distance:   Left Eye Distance:   Bilateral Distance:  Right Eye Near:   Left Eye Near:    Bilateral Near:     Physical Exam Vitals and nursing note reviewed.  Constitutional:      General: She is not in acute distress.    Appearance: Normal appearance. She is not ill-appearing, toxic-appearing or diaphoretic.  Eyes:     General: No scleral icterus. Cardiovascular:     Rate and Rhythm: Normal rate and regular rhythm.     Heart sounds: Normal heart sounds.  Pulmonary:     Effort: Pulmonary effort is normal. No respiratory distress.     Breath sounds: Normal breath sounds. No wheezing or rhonchi.  Genitourinary:    Rectum: External hemorrhoid present.     Comments: Large, thrombosed, external hemorrhoid noted.  Marked tenderness to palpation. Skin:    General: Skin is warm.  Neurological:     Mental Status: She is alert and oriented to person, place, and time.  Psychiatric:        Mood and Affect: Mood normal.        Behavior: Behavior normal.      UC Treatments / Results  Labs (all labs ordered are listed, but only abnormal results are displayed) Labs Reviewed - No data to display  EKG   Radiology No results found.  Procedures Procedures (including critical care time)  Medications Ordered in UC Medications - No data to display  Initial Impression / Assessment and Plan / UC  Course  I have reviewed the triage vital signs and the nursing notes.  Pertinent labs & imaging results that were available during my care of the patient were reviewed by me and considered in my medical decision making (see chart for details).     Thrombosed hemorrhoid-patient advised to report to the ER for further evaluation of thrombosed hemorrhoid that is not improving with use of over-the-counter remedies.  Patient may need surgical intervention for relief Final Clinical Impressions(s) / UC Diagnoses   Final diagnoses:  External hemorrhoid, thrombosed     Discharge Instructions      Please to the ER for further evaluation.    ED Prescriptions   None    PDMP not reviewed this encounter.   Andra Corean BROCKS, PA-C 09/05/24 1406

## 2024-09-05 NOTE — ED Triage Notes (Signed)
 Patient is send down from [facility] with c/o hemorrhoids she is  wks preg. Called [facility] told to see patient in adult ED.

## 2024-09-05 NOTE — ED Triage Notes (Signed)
 Patient is [redacted] weeks pregnant who present with possible hemorrhoids. Patient states she had some diarrhea. Denies any other symptoms.

## 2024-09-05 NOTE — ED Provider Notes (Signed)
 [place] EMERGENCY DEPARTMENT AT [facility] Provider Note   CSN: [id number] Arrival date & time: 09/05/24  1411     Patient presents with: Hemorrhoids   Alexandria [name] is a 28 y.o. female.   HPI   Patient is [redacted] weeks pregnant.  She complains of a hemorrhoid that started earlier this week.  Patient has been trying sitz bath's and some over-the-counter medications without relief.  Patient went to an urgent care who thought she would benefit from ER evaluation.  Patient denies any fevers or chills.  No other complaints she is not having any complications with her pregnancy  Prior to Admission medications   Medication Sig Start Date End Date Taking? Authorizing Provider  hydrocortisone-pramoxine (PROCTOFOAM HC) rectal foam Place 1 applicator rectally 2 (two) times daily. 09/05/24  Yes Randol Simmonds, MD  lidocaine  (XYLOCAINE ) 5 % ointment Apply 1 Application topically 4 (four) times daily as needed. 09/05/24  Yes Randol Simmonds, MD  Prenatal Vit-Fe Fumarate-FA (PRENATAL MULTIVITAMIN) TABS tablet Take 1 tablet by mouth daily.    [provider]    Allergies: Patient has no known allergies.    Review of Systems  Updated Vital Signs BP 112/79   Pulse (!) 113   Temp 99.2 F (37.3 C)   Resp 18   Ht 1.575 m (5' 2)   Wt 80.3 kg   LMP 12/27/2023   SpO2 100%   BMI 32.37 kg/m   Physical Exam Vitals and nursing note reviewed. Exam conducted with a chaperone present.  Constitutional:      General: She is not in acute distress.    Appearance: She is well-developed.  HENT:     Head: Normocephalic and atraumatic.     Right Ear: External ear normal.     Left Ear: External ear normal.  Eyes:     General: No scleral icterus.       Right eye: No discharge.        Left eye: No discharge.     Conjunctiva/sclera: Conjunctivae normal.  Neck:     Trachea: No tracheal deviation.  Cardiovascular:     Rate and Rhythm: Normal rate.  Pulmonary:     Effort:  Pulmonary effort is normal. No respiratory distress.     Breath sounds: No stridor.  Abdominal:     General: There is no distension.  Musculoskeletal:        General: No swelling or deformity.     Cervical back: Neck supple.  Skin:    General: Skin is warm and dry.     Findings: No rash.  Neurological:     Mental Status: She is alert. Mental status is at baseline.     Cranial Nerves: No dysarthria or facial asymmetry.     Motor: No seizure activity.     (all labs ordered are listed, but only abnormal results are displayed) Labs Reviewed - No data to display  EKG: None  Radiology: No results found.   Procedures   Medications Ordered in the ED  lidocaine  (LMX) 4 % cream (has no administration in time range)                                    Medical Decision Making Risk OTC drugs.   Pt presents with complaints of painful hemorrhoid.  On exam no signs of active bleeding.  Hemorrhoid is ttp.  Do not feel surgical  intervention necessary at this point.  Will try topical lidocaine , aloe gel, sitz bath.  Follow up  outpatient     Final diagnoses:  External hemorrhoids    ED Discharge Orders          Ordered    lidocaine  (XYLOCAINE ) 5 % ointment  4 times daily PRN        09/05/24 1543    hydrocortisone-pramoxine (PROCTOFOAM HC) rectal foam  2 times daily        09/05/24 1543               Randol Simmonds, MD 09/05/24 1544

## 2024-09-05 NOTE — ED Triage Notes (Signed)
 Patient is 38wk and 3 days pregnant and complains of large hemorrhoid x 5 days.

## 2024-09-05 NOTE — Discharge Instructions (Addendum)
 Please to the ER for further evaluation.

## 2024-09-14 ENCOUNTER — Ambulatory Visit (INDEPENDENT_AMBULATORY_CARE_PROVIDER_SITE_OTHER): Payer: Self-pay | Admitting: Obstetrics and Gynecology

## 2024-09-14 ENCOUNTER — Other Ambulatory Visit: Payer: Self-pay

## 2024-09-14 VITALS — BP 112/74 | HR 111 | Wt 180.4 lb

## 2024-09-14 DIAGNOSIS — O09899 Supervision of other high risk pregnancies, unspecified trimester: Secondary | ICD-10-CM

## 2024-09-14 DIAGNOSIS — O09893 Supervision of other high risk pregnancies, third trimester: Secondary | ICD-10-CM

## 2024-09-14 DIAGNOSIS — Z3493 Encounter for supervision of normal pregnancy, unspecified, third trimester: Secondary | ICD-10-CM

## 2024-09-14 DIAGNOSIS — Z3A39 39 weeks gestation of pregnancy: Secondary | ICD-10-CM

## 2024-09-14 DIAGNOSIS — Z3490 Encounter for supervision of normal pregnancy, unspecified, unspecified trimester: Secondary | ICD-10-CM

## 2024-09-14 NOTE — Progress Notes (Signed)
   PRENATAL VISIT NOTE  Subjective:  Alexandria [name] is a 28 y.o. H4E5995 at [redacted]w[redacted]d being seen today for ongoing prenatal care.  She is currently monitored for the following issues for this low-risk pregnancy and has Supervision of low-risk pregnancy and Short interval between pregnancies affecting pregnancy, antepartum on their problem list.  Patient doing well with no acute concerns today. She reports no complaints.  Contractions: Irritability. Vag. Bleeding: None.  Movement: Present. Denies leaking of fluid.   The following portions of the patient's history were reviewed and updated as appropriate: allergies, current medications, past family history, past medical history, past social history, past surgical history and problem list. Problem list updated.  Objective:   Vitals:   09/14/24 1139  BP: 112/74  Pulse: (!) 111  Weight: 180 lb 6.4 oz (81.8 kg)    Fetal Status: Fetal Heart Rate (bpm): 151   Movement: Present     General:  Alert, oriented and cooperative. Patient is in no acute distress.  Skin: Skin is warm and dry. No rash noted.   Cardiovascular: Normal heart rate noted  Respiratory: Normal respiratory effort, no problems with respiration noted  Abdomen: Soft, gravid, appropriate for gestational age.  Pain/Pressure: Present     Pelvic: Cervical exam performed Dilation: 2.5 Effacement (%): 60 Station: -2  Extremities: Normal range of motion.  Edema: None  Mental Status:  Normal mood and affect. Normal behavior. Normal judgment and thought content.   Assessment and Plan:  Pregnancy: G5P4004 at [redacted]w[redacted]d  1. [redacted] weeks gestation of pregnancy (Primary)   2. Encounter for supervision of low-risk pregnancy in third trimester Continue routine prenatal care IOL scheduled Nst/BPP at next visit 2/2 to postdates  3. Short interval between pregnancies affecting pregnancy, antepartum   Term labor symptoms and general obstetric precautions including but not limited to  vaginal bleeding, contractions, leaking of fluid and fetal movement were reviewed in detail with the patient.  Please refer to After Visit Summary for other counseling recommendations.   Return in about 1 week (around 09/21/2024) for ROB, in person.   Jerilynn Buddle, MD Faculty Attending Center for [facility]

## 2024-09-15 ENCOUNTER — Inpatient Hospital Stay (HOSPITAL_COMMUNITY): Payer: MEDICAID | Admitting: Anesthesiology

## 2024-09-15 ENCOUNTER — Encounter (HOSPITAL_COMMUNITY): Payer: Self-pay | Admitting: Obstetrics and Gynecology

## 2024-09-15 ENCOUNTER — Inpatient Hospital Stay (HOSPITAL_COMMUNITY)
Admission: AD | Admit: 2024-09-15 | Discharge: 2024-09-16 | DRG: 807 | Disposition: A | Discharge status: Home / Self Care | Payer: MEDICAID | Attending: Family Medicine | Admitting: Family Medicine

## 2024-09-15 ENCOUNTER — Other Ambulatory Visit: Payer: Self-pay

## 2024-09-15 DIAGNOSIS — O26893 Other specified pregnancy related conditions, third trimester: Secondary | ICD-10-CM | POA: Diagnosis present

## 2024-09-15 DIAGNOSIS — O09899 Supervision of other high risk pregnancies, unspecified trimester: Secondary | ICD-10-CM

## 2024-09-15 DIAGNOSIS — Z3493 Encounter for supervision of normal pregnancy, unspecified, third trimester: Principal | ICD-10-CM

## 2024-09-15 DIAGNOSIS — Z833 Family history of diabetes mellitus: Secondary | ICD-10-CM | POA: Diagnosis not present

## 2024-09-15 DIAGNOSIS — Z8249 Family history of ischemic heart disease and other diseases of the circulatory system: Secondary | ICD-10-CM

## 2024-09-15 DIAGNOSIS — O4202 Full-term premature rupture of membranes, onset of labor within 24 hours of rupture: Secondary | ICD-10-CM

## 2024-09-15 DIAGNOSIS — Z3A39 39 weeks gestation of pregnancy: Secondary | ICD-10-CM | POA: Diagnosis not present

## 2024-09-15 LAB — RESP PANEL BY RT-PCR (RSV, FLU A&B, COVID)  RVPGX2
Influenza A by PCR: NEGATIVE
Influenza B by PCR: NEGATIVE
Resp Syncytial Virus by PCR: NEGATIVE
SARS Coronavirus 2 by RT PCR: NEGATIVE

## 2024-09-15 LAB — RPR: RPR Ser Ql: NONREACTIVE

## 2024-09-15 LAB — CBC
HCT: 35.8 % — ABNORMAL LOW (ref 36.0–46.0)
Hemoglobin: 11.7 g/dL — ABNORMAL LOW (ref 12.0–15.0)
MCH: 28.3 pg (ref 26.0–34.0)
MCHC: 32.7 g/dL (ref 30.0–36.0)
MCV: 86.5 fL (ref 80.0–100.0)
Platelets: 216 K/uL (ref 150–400)
RBC: 4.14 MIL/uL (ref 3.87–5.11)
RDW: 13.8 % (ref 11.5–15.5)
WBC: 10.2 K/uL (ref 4.0–10.5)
nRBC: 0 % (ref 0.0–0.2)

## 2024-09-15 LAB — TYPE AND SCREEN
ABO/RH(D): O POS
Antibody Screen: NEGATIVE

## 2024-09-15 MED ORDER — EPHEDRINE 5 MG/ML INJ: 10.0000 mg | INTRAVENOUS | Status: DC | PRN

## 2024-09-15 MED ORDER — LACTATED RINGERS IV SOLN: INTRAVENOUS | Status: DC

## 2024-09-15 MED ORDER — FENTANYL-BUPIVACAINE-NACL 0.5-0.125-0.9 MG/250ML-% EP SOLN
12.0000 mL/h | EPIDURAL | Status: DC | PRN
  Administered 2024-09-15: 12 mL/h via EPIDURAL
  Filled 2024-09-15: qty 250

## 2024-09-15 MED ORDER — LIDOCAINE HCL (PF) 1 % IJ SOLN
30.0000 mL | INTRAMUSCULAR | Status: AC | PRN
  Administered 2024-09-15: 30 mL via SUBCUTANEOUS
  Filled 2024-09-15: qty 30

## 2024-09-15 MED ORDER — SOD CITRATE-CITRIC ACID 500-334 MG/5ML PO SOLN: 30.0000 mL | ORAL | Status: DC | PRN

## 2024-09-15 MED ORDER — OXYTOCIN BOLUS FROM INFUSION
333.0000 mL | Freq: Once | INTRAVENOUS | Status: AC
  Administered 2024-09-15: 333 mL via INTRAVENOUS

## 2024-09-15 MED ORDER — ACETAMINOPHEN 325 MG PO TABS: 650.0000 mg | ORAL_TABLET | ORAL | Status: DC | PRN

## 2024-09-15 MED ORDER — LIDOCAINE HCL (PF) 1 % IJ SOLN
INTRAMUSCULAR | Status: DC | PRN
  Administered 2024-09-15: 3 mL via SUBCUTANEOUS

## 2024-09-15 MED ORDER — BENZOCAINE-MENTHOL 20-0.5 % EX AERO: 1.0000 | INHALATION_SPRAY | CUTANEOUS | Status: DC | PRN

## 2024-09-15 MED ORDER — SIMETHICONE 80 MG PO CHEW: 80.0000 mg | CHEWABLE_TABLET | ORAL | Status: DC | PRN

## 2024-09-15 MED ORDER — COCONUT OIL OIL: 1.0000 | TOPICAL_OIL | Status: DC | PRN

## 2024-09-15 MED ORDER — ONDANSETRON HCL 4 MG/2ML IJ SOLN: 4.0000 mg | Freq: Four times a day (QID) | INTRAMUSCULAR | Status: DC | PRN

## 2024-09-15 MED ORDER — DIBUCAINE (PERIANAL) 1 % EX OINT: 1.0000 | TOPICAL_OINTMENT | CUTANEOUS | Status: DC | PRN

## 2024-09-15 MED ORDER — TRANEXAMIC ACID-NACL 1000-0.7 MG/100ML-% IV SOLN
1000.0000 mg | INTRAVENOUS | Status: AC
  Administered 2024-09-15: 1000 mg via INTRAVENOUS

## 2024-09-15 MED ORDER — ONDANSETRON HCL 4 MG PO TABS: 4.0000 mg | ORAL_TABLET | ORAL | Status: DC | PRN

## 2024-09-15 MED ORDER — IBUPROFEN 600 MG PO TABS
600.0000 mg | ORAL_TABLET | Freq: Four times a day (QID) | ORAL | Status: DC
  Administered 2024-09-15 – 2024-09-16 (×3): 600 mg via ORAL
  Filled 2024-09-15 (×3): qty 1

## 2024-09-15 MED ORDER — OXYTOCIN-SODIUM CHLORIDE 30-0.9 UT/500ML-% IV SOLN
1.0000 m[IU]/min | INTRAVENOUS | Status: DC
  Administered 2024-09-15: 2 m[IU]/min via INTRAVENOUS

## 2024-09-15 MED ORDER — PRENATAL MULTIVITAMIN CH
1.0000 | ORAL_TABLET | Freq: Every day | ORAL | Status: DC
  Administered 2024-09-16: 1 via ORAL
  Filled 2024-09-15: qty 1

## 2024-09-15 MED ORDER — WITCH HAZEL-GLYCERIN EX PADS: 1.0000 | MEDICATED_PAD | CUTANEOUS | Status: DC | PRN

## 2024-09-15 MED ORDER — METHYLERGONOVINE MALEATE 0.2 MG/ML IJ SOLN
0.2000 mg | Freq: Once | INTRAMUSCULAR | Status: AC
  Administered 2024-09-15: 0.2 mg via INTRAMUSCULAR

## 2024-09-15 MED ORDER — LACTATED RINGERS IV SOLN: 500.0000 mL | INTRAVENOUS | Status: DC | PRN

## 2024-09-15 MED ORDER — SODIUM CHLORIDE 0.9 % IV SOLN
INTRAVENOUS | Status: DC | PRN
  Administered 2024-09-15: 6 mL via EPIDURAL

## 2024-09-15 MED ORDER — DIPHENHYDRAMINE HCL 50 MG/ML IJ SOLN: 12.5000 mg | INTRAMUSCULAR | Status: DC | PRN

## 2024-09-15 MED ORDER — ZOLPIDEM TARTRATE 5 MG PO TABS: 5.0000 mg | ORAL_TABLET | Freq: Every evening | ORAL | Status: DC | PRN

## 2024-09-15 MED ORDER — DIPHENHYDRAMINE HCL 25 MG PO CAPS: 25.0000 mg | ORAL_CAPSULE | Freq: Four times a day (QID) | ORAL | Status: DC | PRN

## 2024-09-15 MED ORDER — FENTANYL CITRATE (PF) 100 MCG/2ML IJ SOLN: 50.0000 ug | INTRAMUSCULAR | Status: DC | PRN

## 2024-09-15 MED ORDER — TRANEXAMIC ACID-NACL 1000-0.7 MG/100ML-% IV SOLN
INTRAVENOUS | Status: AC
  Filled 2024-09-15: qty 100

## 2024-09-15 MED ORDER — LIDOCAINE-EPINEPHRINE (PF) 2 %-1:200000 IJ SOLN
INTRAMUSCULAR | Status: DC | PRN
  Administered 2024-09-15: 3 mL via EPIDURAL

## 2024-09-15 MED ORDER — PHENYLEPHRINE 80 MCG/ML (10ML) SYRINGE FOR IV PUSH (FOR BLOOD PRESSURE SUPPORT): 80.0000 ug | PREFILLED_SYRINGE | INTRAVENOUS | Status: DC | PRN

## 2024-09-15 MED ORDER — ONDANSETRON HCL 4 MG/2ML IJ SOLN: 4.0000 mg | INTRAMUSCULAR | Status: DC | PRN

## 2024-09-15 MED ORDER — TERBUTALINE SULFATE 1 MG/ML IJ SOLN: 0.2500 mg | Freq: Once | INTRAMUSCULAR | Status: DC | PRN

## 2024-09-15 MED ORDER — SENNOSIDES-DOCUSATE SODIUM 8.6-50 MG PO TABS
2.0000 | ORAL_TABLET | Freq: Every day | ORAL | Status: DC
  Administered 2024-09-16: 2 via ORAL
  Filled 2024-09-15: qty 2

## 2024-09-15 MED ORDER — TETANUS-DIPHTH-ACELL PERTUSSIS 5-2-15.5 LF-MCG/0.5 IM SUSP: 0.5000 mL | Freq: Once | INTRAMUSCULAR | Status: DC

## 2024-09-15 MED ORDER — METHYLERGONOVINE MALEATE 0.2 MG/ML IJ SOLN
INTRAMUSCULAR | Status: AC
  Filled 2024-09-15: qty 1

## 2024-09-15 MED ORDER — OXYTOCIN-SODIUM CHLORIDE 30-0.9 UT/500ML-% IV SOLN
2.5000 [IU]/h | INTRAVENOUS | Status: DC
  Filled 2024-09-15: qty 500

## 2024-09-15 MED ORDER — PHENYLEPHRINE 80 MCG/ML (10ML) SYRINGE FOR IV PUSH (FOR BLOOD PRESSURE SUPPORT)
80.0000 ug | PREFILLED_SYRINGE | INTRAVENOUS | Status: DC | PRN
  Filled 2024-09-15: qty 10

## 2024-09-15 MED ORDER — LACTATED RINGERS IV SOLN
500.0000 mL | Freq: Once | INTRAVENOUS | Status: AC
  Administered 2024-09-15: 500 mL via INTRAVENOUS

## 2024-09-15 MED ORDER — OXYCODONE-ACETAMINOPHEN 5-325 MG PO TABS: 1.0000 | ORAL_TABLET | ORAL | Status: DC | PRN

## 2024-09-15 NOTE — H&P (Signed)
 OBSTETRIC ADMISSION HISTORY AND PHYSICAL  Alexandria [name] is a 28 y.o. female [phone] with IUP at [redacted]w[redacted]d by 14 week U/S presenting for SOL. She reports +FMs, No LOF, no VB, no blurry vision, headaches or peripheral edema, and RUQ pain.  She plans on breast and bottle feeding. She request IUD vs nexplanon for birth control. She received her prenatal care at [facility].  Dating: By U/S at 14 weeks --->  Estimated Date of Delivery: 09/17/24  Sono:    @[redacted]w[redacted]d , CWD, normal anatomy, cephalic presentation, 1821g, 82% EFW   Prenatal History/Complications: none  Past Medical History: Past Medical History:  Diagnosis Date   Medical history non-contributory     Past Surgical History: Past Surgical History:  Procedure Laterality Date   NO PAST SURGERIES      Obstetrical History: OB History     Gravida  5   Para  4   Term  4   Preterm  0   AB  0   Living  4      SAB  0   IAB  0   Ectopic  0   Multiple  0   Live Births  4           Social History Social History   Socioeconomic History   Marital status: Single    Spouse name: Pablo   Number of children: 3   Years of education: Not on file   Highest education level: 12th grade  Occupational History   Not on file  Tobacco Use   Smoking status: Never   Smokeless tobacco: Never  Vaping Use   Vaping status: Never Used  Substance and Sexual Activity   Alcohol use: No   Drug use: No   Sexual activity: Yes  Other Topics Concern   Not on file  Social History Narrative   Not on file   Social Drivers of Health   Financial Resource Strain: Low Risk  (11/29/2022)   Overall Financial Resource Strain (CARDIA)    Difficulty of Paying Living Expenses: Not very hard  Food Insecurity: No Food Insecurity (04/14/2024)   Hunger Vital Sign    Worried About Running Out of Food in the Last Year: Never true    Ran Out of Food in the Last Year: Never true  Transportation Needs: No Transportation  Needs (04/14/2024)   PRAPARE - Administrator, Civil Service (Medical): No    Lack of Transportation (Non-Medical): No  Physical Activity: Inactive (11/29/2022)   Exercise Vital Sign    Days of Exercise per Week: 1 day    Minutes of Exercise per Session: 0 min  Stress: No Stress Concern Present (11/29/2022)   [employer] of Occupational Health - Occupational Stress Questionnaire    Feeling of Stress : Only a little  Social Connections: Unknown (11/29/2022)   Social Connection and Isolation Panel    Frequency of Communication with Friends and Family: Patient declined    Frequency of Social Gatherings with Friends and Family: Once a week    Attends Religious Services: Never    Database administrator or Organizations: No    Attends Engineer, structural: Not on file    Marital Status: Patient declined    Family History: Family History  Problem Relation Age of Onset   Diabetes Mother    Healthy Father    Hypertension Maternal Grandmother    Diabetes Maternal Grandmother    Asthma Neg Hx  Heart disease Neg Hx    Stroke Neg Hx     Allergies: No Known Allergies  Medications Prior to Admission  Medication Sig Dispense Refill Last Dose/Taking   hydrocortisone-pramoxine (PROCTOFOAM HC) rectal foam Place 1 applicator rectally 2 (two) times daily. 10 g G 09/14/2024   lidocaine  (XYLOCAINE ) 5 % ointment Apply 1 Application topically 4 (four) times daily as needed. 35.44 g 0 09/14/2024   Prenatal Vit-Fe Fumarate-FA (PRENATAL MULTIVITAMIN) TABS tablet Take 1 tablet by mouth daily.   09/14/2024     Review of Systems   All systems reviewed and negative except as stated in HPI  Height 5' 2 (1.575 m), weight 83 kg, last menstrual period 12/27/2023, unknown if currently breastfeeding. General appearance: alert, cooperative, appears stated age, and no distress Lungs: clear to auscultation bilaterally Heart: regular rate and rhythm Abdomen: soft, non-tender;  bowel sounds normal Extremities: Homans sign is negative, no sign of DVT Presentation: cephalic Fetal monitoringBaseline: 155 bpm, Variability: Good {> 6 bpm), Accelerations: Reactive, and Decelerations: Absent Uterine activityFrequency: Every 4-5 minutes     Prenatal labs: ABO, Rh: O/Positive/-- (07/24 1214) Antibody: Negative (07/24 1214) Rubella: 1.06 (07/24 1214) RPR: Non Reactive (09/24 1632)  HBsAg: Negative (07/24 1214)  HIV: Non Reactive (09/24 1632)  GBS: Negative/-- (09/24 1714)    Lab Results  Component Value Date   GBS Negative 08/25/2024   GTT not completed, A1c normal Genetic screening  not completed Anatomy US  normal anatomy  Immunization History  Administered Date(s) Administered   Influenza,inj,Quad PF,6+ Mos 08/27/2017, 08/28/2018   Tdap 08/27/2017, 07/08/2018, 03/22/2022    Prenatal Transfer Tool  Maternal Diabetes: No Genetic Screening: not completed Maternal Ultrasounds/Referrals: Normal Fetal Ultrasounds or other Referrals:  None Maternal Substance Abuse:  No Significant Maternal Medications:  None Significant Maternal Lab Results: Group B Strep negative Number of Prenatal Visits:greater than 3 verified prenatal visits Maternal Vaccinations: none Other Comments:  None   No results found for this or any previous visit (from the past 24 hours).  Patient Active Problem List   Diagnosis Date Noted   Supervision of low-risk pregnancy 04/08/2024   Short interval between pregnancies affecting pregnancy, antepartum 04/08/2024    Assessment/Plan:  Alexandria [name] is a 28 y.o. H4E5995 at [redacted]w[redacted]d here for SOL.  #Labor:expectant management #Pain: epidural #FWB: Category I #GBS status:  negative #Feeding: Breastmilk  and Formula #Reproductive Life planning: nexplanon vs. IUD #Circ:  no  Alexandria DELENA Courts, MD  09/15/2024, 8:50 AM

## 2024-09-15 NOTE — MAU Note (Signed)
 Alexandria [name] is a 28 y.o. at [redacted]w[redacted]d here in MAU reporting: contractions since 0500 this morning and no FM this am, NO c/o SROM, vaginal bleeding, chest pain, or visual disturbances. EFM explained and applied to soft non tender abd. Physical begun.  No FM this am   Onset of complaint: 0500     FHT: 160  Lab orders placed from triage: mau labor

## 2024-09-15 NOTE — Lactation Note (Signed)
 This note was copied from a baby's chart. Lactation Consultation Note  Patient Name: Alexandria [name] Unijb'd Date: 09/15/2024 Age:26 hours Reason for consult: Initial assessment;Term  P5- MOB plans to offer both breast milk and formula. Infant last ate 3 hrs prior, so LC encouraged latching infant together. MOB declined assistance and reported that she would latch infant after eating her dinner. LC encouraged MOB to feed infant soon because we do not want him to go past 3 hrs without a feeding. MOB reports that infant latches well so far. MOB informed LC that infant has something weird with his lip. MOB asked for LC to look at it. MOB reports that the doctor told her that nothing is wrong but she is concerned. 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 LC assessed infant's palate and did not feel a cleft. LC encouraged MOB to talk with the pediatrician to discuss her concerns. Infant is able to create a closed suction and had a strong rhythmic pull on LC's finger. MOB has no pump for home, so LC provided her with a manual pump (21mm flange).  LC reviewed the first 24 hr birthday nap, day 2 cluster feeding, feeding infant on cue 8-12x in 24 hrs, not allowing infant to go over 3 hrs without a feeding, CDC milk storage guidelines, LC services handout and engorgement/breast care. LC encouraged MOB to call for further assistance as needed.  Maternal Data Has patient been taught Hand Expression?: No Does the patient have breastfeeding experience prior to this delivery?: Yes How long did the patient breastfeed?: longest she breast fed was 6 months with her 4th child  Feeding Mother's Current Feeding Choice: Breast Milk and Formula  Lactation Tools Discussed/Used Tools: Pump;Flanges Flange Size: 21 Breast pump type: Manual Pump Education: Setup, frequency, and cleaning;Milk Storage Reason for Pumping: MOB has no pump Pumping frequency: 15-20 min every 3  hrs  Interventions Interventions: Breast feeding basics reviewed;Education;LC Services brochure  Discharge Discharge Education: Engorgement and breast care;Warning signs for feeding baby Pump: Manual  Consult Status Consult Status: Follow-up Date: 09/16/24 Follow-up type: In-patient    Recardo Hoit BS, IBCLC 09/15/2024, 10:18 PM

## 2024-09-15 NOTE — Progress Notes (Signed)
 Alexandria [name] is a 28 y.o. H4E5995 at [redacted]w[redacted]d by ultrasound admitted for active labor  Subjective: Patient is comfortable with epidural. No questions or concerns at this time.  Objective: BP 103/77   Pulse (!) 103   Temp 98.4 F (36.9 C) (Oral)   Resp 16   Ht 5' 2 (1.575 m)   Wt 83 kg   LMP 12/27/2023   SpO2 98%   BMI 33.47 kg/m  No intake/output data recorded. No intake/output data recorded.  FHT:  FHR: 145 bpm, variability: moderate,  accelerations:  Present,  decelerations:  Absent UC:   irregular, every 5-6 minutes SVE:   Dilation: 8 Effacement (%): 90 Station: -2 Exam by:: Antonetta Perks RNC  Labs: Lab Results  Component Value Date   WBC 10.2 09/15/2024   HGB 11.7 (L) 09/15/2024   HCT 35.8 (L) 09/15/2024   MCV 86.5 09/15/2024   PLT 216 09/15/2024    Assessment / Plan: Spontaneous labor, progressing normally  Labor: Progressing on Pitocin , will continue to increase then AROM Preeclampsia:  no signs or symptoms of toxicity Fetal Wellbeing:  Category I Pain Control:  Epidural I/D:  n/a Anticipated MOD:  NSVD  Burnard [name], Student-PA 09/15/2024, 11:57 AM  A physician assistant assisted in this documentation and that I have also personally reperformed the history, physical exam and all medical decision making activities.  I have verified that all services and findings are accurately documented in this student's note; and I agree with management and plan as outlined in the documentation.   [name], MD 09/15/2024 1:35 PM

## 2024-09-15 NOTE — Discharge Summary (Shared)
 Postpartum Discharge Summary  Date of Service updated***     Patient Name: Alexandria [name] DOB: [birth year] MRN: [id number]  Date of admission: 09/15/2024 Delivery date:09/15/2024 Delivering provider: JOMARIE CAMPI A Date of discharge: 09/15/2024  Admitting diagnosis: Post term pregnancy over 40 weeks [O48.0] Intrauterine pregnancy: [redacted]w[redacted]d     Secondary diagnosis:  Active Problems:   * No active hospital problems. *  Additional problems: ***    Discharge diagnosis: {DX.:23714}                                              Post partum procedures:{Postpartum procedures:23558} Augmentation: {Augmentation:20782} Complications: {OB Labor/Delivery Complications:20784}  Hospital course: {Courses:23701}  Magnesium  Sulfate received: {Mag received:30440022} BMZ received: {BMZ received:30440023} Rhophylac:{Rhophylac received:30440032} FFM:{FFM:69559966} T-DaP:{Tdap:23962} Flu: {Qol:76036} RSV Vaccine received: {RSV:31013} Transfusion:{Transfusion received:30440034}  Immunizations received: Immunization History  Administered Date(s) Administered   Influenza,inj,Quad PF,6+ Mos 08/27/2017, 08/28/2018   Tdap 08/27/2017, 07/08/2018, 03/22/2022    Physical exam  Vitals:   09/15/24 1530 09/15/24 1600 09/15/24 1622 09/15/24 1630  BP: (!) 112/55 117/71 (!) 109/58 121/71  Pulse: 98 95 85 94  Resp: 16 15    Temp:      TempSrc:      SpO2:      Weight:      Height:       General: {Exam; general:21111117} Lochia: {Desc; appropriate/inappropriate:30686::appropriate} Uterine Fundus: {Desc; firm/soft:30687} Incision: {Exam; incision:21111123} DVT Evaluation: {Exam; dvt:2111122} Labs: Lab Results  Component Value Date   WBC 10.2 09/15/2024   HGB 11.7 (L) 09/15/2024   HCT 35.8 (L) 09/15/2024   MCV 86.5 09/15/2024   PLT 216 09/15/2024       No data to display         Edinburgh Score:    01/28/2023    4:48 PM  Edinburgh Postnatal Depression Scale  Screening Tool  I have been able to laugh and see the funny side of things. 0  I have looked forward with enjoyment to things. 0  I have blamed myself unnecessarily when things went wrong. 0  I have been anxious or worried for no good reason. 0  I have felt scared or panicky for no good reason. 0  Things have been getting on top of me. 0  I have been so unhappy that I have had difficulty sleeping. 2  I have felt sad or miserable. 0  I have been so unhappy that I have been crying. 0  The thought of harming myself has occurred to me. 0  Edinburgh Postnatal Depression Scale Total 2      Data saved with a previous flowsheet row definition   No data recorded  After visit meds:  Allergies as of 09/15/2024   No Known Allergies   Med Rec must be completed prior to using this [facility]***        Discharge home in stable condition Infant Feeding: {Baby feeding:23562} Infant Disposition:{CHL IP OB HOME WITH FNUYZM:76418} Discharge instruction: per After Visit Summary and Postpartum booklet. Activity: Advance as tolerated. Pelvic rest for 6 weeks.  Diet: {OB ipzu:78888878} Future Appointments: Future Appointments  Date Time Provider Department Center  09/21/2024  2:35 PM [facility] US2 [facility]  09/22/2024  8:15 AM Fredirick Glenys RAMAN, MD [facility]   Follow up Visit:   Please schedule this patient for a {Visit type:23955} postpartum visit in {Postpartum  visit:23953} with the following provider: {Provider type:23954}. Additional Postpartum F/U:{PP Procedure:23957}  {Risk level:23960} pregnancy complicated by: {complication:23959} Delivery mode:    Anticipated Birth Control:  {Birth Control:23956}   09/15/2024 [name], MD

## 2024-09-15 NOTE — Anesthesia Procedure Notes (Signed)
 Epidural Patient location during procedure: OB Start time: 09/15/2024 9:25 AM End time: 09/15/2024 9:35 AM  Staffing Anesthesiologist: Boone Fess, MD Performed: anesthesiologist   Preanesthetic Checklist Completed: patient identified, IV checked, site marked, risks and benefits discussed, surgical consent, monitors and equipment checked, pre-op evaluation and timeout performed  Epidural Patient position: sitting Prep: ChloraPrep Patient monitoring: heart rate, continuous pulse ox and blood pressure Approach: midline Location: L3-L4 Injection technique: LOR saline  Needle:  Needle type: Tuohy  Needle gauge: 17 G Needle length: 9 cm Needle insertion depth: 5 cm Catheter type: closed end flexible Catheter size: 19 Gauge Catheter at skin depth: 10 cm Test dose: negative and 1.5% lidocaine  with Epi 1:200 K  Assessment Sensory level: T10 Events: blood not aspirated, no cerebrospinal fluid, injection not painful, no injection resistance, no paresthesia and negative IV test  Additional Notes First/one attempt Pt. Evaluated and documentation done after procedure finished. Patient identified. Risks/Benefits/Options discussed with patient including but not limited to bleeding, infection, nerve damage, paralysis, failed block, incomplete pain control, headache, blood pressure changes, nausea, vomiting, reactions to medication both or allergic, itching and postpartum back pain. Confirmed with bedside nurse the patient's most recent platelet count. Confirmed with patient that they are not currently taking any anticoagulation, have any bleeding history or any family history of bleeding disorders. Patient expressed understanding and wished to proceed. All questions were answered. Sterile technique was used throughout the entire procedure. Please see nursing notes for vital signs. Test dose was given through epidural catheter and negative prior to continuing to dose epidural or start infusion.  Warning signs of high block given to the patient including shortness of breath, tingling/numbness in hands, complete motor block, or any concerning symptoms with instructions to call for help. Patient was given instructions on fall risk and not to get out of bed. All questions and concerns addressed with instructions to call with any issues or inadequate analgesia.     Patient tolerated the insertion well without immediate complications.  Reason for block: procedure for painReason for block:procedure for pain

## 2024-09-15 NOTE — Progress Notes (Signed)
 Labor Progress Note Alexandria [name] is a 28 y.o. H4E5995 at [redacted]w[redacted]d presented for SOL  S:  Comfortable, amenable to AROM  O:  BP 107/69   Pulse 93   Temp 98.4 F (36.9 C) (Oral)   Resp 15   Ht 5' 2 (1.575 m)   Wt 83 kg   LMP 12/27/2023   SpO2 98%   BMI 33.47 kg/m  EFM: baseline 140 bpm/ moderate variability/ + accels/ - decels  Toco/IUPC: Irritability SVE: Dilation: 8 Effacement (%): 90 Cervical Position: Posterior, Middle Station: -2 Presentation: Vertex Exam by:: Dr. Jomarie  A/P: 28 y.o. H4E5995 [redacted]w[redacted]d  1. Labor: AROM for clear fluid, plan for pitocin  2. Pain: Epidural in place 3. GBS negative  - Anticipate SVD.  Charlie DELENA Jomarie, MD 1:36 PM

## 2024-09-15 NOTE — Anesthesia Preprocedure Evaluation (Signed)
 Anesthesia Evaluation  Patient identified by MRN, date of birth, ID band Patient awake    Reviewed: Allergy & Precautions, NPO status , Patient's Chart, lab work & pertinent test results  History of Anesthesia Complications Negative for: history of anesthetic complications  Airway Mallampati: II  TM Distance: >3 FB Neck ROM: Full    Dental no notable dental hx. (+) Teeth Intact   Pulmonary neg pulmonary ROS, neg sleep apnea, neg COPD, Patient abstained from smoking.Not current smoker   Pulmonary exam normal breath sounds clear to auscultation       Cardiovascular Exercise Tolerance: Good METS(-) hypertension(-) CAD and (-) Past MI negative cardio ROS (-) dysrhythmias  Rhythm:Regular Rate:Normal - Systolic murmurs    Neuro/Psych negative neurological ROS  negative psych ROS   GI/Hepatic ,neg GERD  ,,(+)     (-) substance abuse    Endo/Other  neg diabetes    Renal/GU negative Renal ROS     Musculoskeletal   Abdominal   Peds  Hematology Denies blood thinner use or bleeding disorders.    Anesthesia Other Findings Denies blood thinner use or bleeding diatheses. Recent labs reviewed. Past Medical History: No date: Medical history non-contributory   Reproductive/Obstetrics (+) Pregnancy                              Anesthesia Physical Anesthesia Plan  ASA: 2  Anesthesia Plan: Epidural   Post-op Pain Management:    Induction:   PONV Risk Score and Plan: 2 and Treatment may vary due to age or medical condition and Ondansetron   Airway Management Planned: Natural Airway  Additional Equipment:   Intra-op Plan:   Post-operative Plan:   Informed Consent: I have reviewed the patients History and Physical, chart, labs and discussed the procedure including the risks, benefits and alternatives for the proposed anesthesia with the patient or authorized representative who has indicated  his/her understanding and acceptance.       Plan Discussed with: Surgeon  Anesthesia Plan Comments: (Discussed R/B/A of neuraxial anesthesia technique with patient: - rare risks of spinal/epidural hematoma, nerve damage, infection - Risk of PDPH - Risk of itching - Risk of nausea and vomiting - Risk of poor block necessitating replacement of epidural. - Risk of allergic reactions. Patient voiced understanding.)        Anesthesia Quick Evaluation

## 2024-09-15 NOTE — MAU Note (Signed)

## 2024-09-16 LAB — CBC
HCT: 32 % — ABNORMAL LOW (ref 36.0–46.0)
Hemoglobin: 10.6 g/dL — ABNORMAL LOW (ref 12.0–15.0)
MCH: 28.6 pg (ref 26.0–34.0)
MCHC: 33.1 g/dL (ref 30.0–36.0)
MCV: 86.3 fL (ref 80.0–100.0)
Platelets: 188 K/uL (ref 150–400)
RBC: 3.71 MIL/uL — ABNORMAL LOW (ref 3.87–5.11)
RDW: 13.7 % (ref 11.5–15.5)
WBC: 9.4 K/uL (ref 4.0–10.5)
nRBC: 0 % (ref 0.0–0.2)

## 2024-09-16 LAB — BIRTH TISSUE RECOVERY COLLECTION (PLACENTA DONATION)

## 2024-09-16 MED ORDER — IBUPROFEN 600 MG PO TABS: 600.0000 mg | ORAL_TABLET | Freq: Four times a day (QID) | ORAL | Status: AC

## 2024-09-16 MED ORDER — ACETAMINOPHEN 325 MG PO TABS: 650.0000 mg | ORAL_TABLET | ORAL | Status: AC | PRN

## 2024-09-16 NOTE — Patient Instructions (Signed)
 If interested in an outpatient lactation consult in office or virtually please reach out to us  at [facility] (First Floor) [address] [place]  Please feel free to out with any lactation related questions or concerns [phone]  to leave a message for our lactation voicemail box.  Lactation support groups:  [facility], Tuesdays 10:00 am -12:00 pm at [address] on the second floor in the conference room, lactating parents and lap babies welcome.  [email or web address]  [email or web address]      Vermell CINDERELLA Pelt, [facility] Center for [facility]

## 2024-09-17 NOTE — Anesthesia Postprocedure Evaluation (Signed)
 Anesthesia Post Note  Patient: Alexandria [name]  Procedure(s) Performed: AN AD HOC LABOR EPIDURAL     Anesthesia Type: Epidural Vital Signs Assessment: post-procedure vital signs reviewed and stable Anesthetic complications: no Comments: Patient discharged prior to evaluation by anesthesiology team. Nursing and physician notes reviewed with no apparent complications.   No notable events documented.  Last Vitals: There were no vitals filed for this visit.  Last Pain: There were no vitals filed for this visit. Pain Goal:                   Rome Ade

## 2024-09-21 ENCOUNTER — Other Ambulatory Visit: Payer: Self-pay

## 2024-09-22 ENCOUNTER — Encounter: Payer: Self-pay | Admitting: Family Medicine

## 2024-09-22 ENCOUNTER — Telehealth (HOSPITAL_COMMUNITY): Payer: Self-pay | Admitting: *Deleted

## 2024-09-22 NOTE — Telephone Encounter (Signed)
 09/22/2024  Name: Alexandria [name] MRN: [id number] DOB: 05/30/1996  Reason for Call:  Transition of Care Hospital Discharge Call  Contact Status: Patient Contact Status: Unable to contact (Family member answered phone. States patient can be called back in about 20 minutes. Will attempt a call back.)  Language assistant needed:          Follow-Up Questions:    Van Postnatal Depression Scale:  In the Past 7 Days:    PHQ2-9 Depression Scale:     Discharge Follow-up:    Post-discharge interventions: NA  Steva Banter, RN 09/22/2024 [phone]

## 2024-09-23 ENCOUNTER — Inpatient Hospital Stay (HOSPITAL_COMMUNITY): Admission: RE | Admit: 2024-09-23 | Payer: Self-pay | Source: Home / Self Care | Admitting: Obstetrics and Gynecology

## 2024-09-23 ENCOUNTER — Inpatient Hospital Stay (HOSPITAL_COMMUNITY): Payer: Self-pay

## 2024-09-25 ENCOUNTER — Telehealth (HOSPITAL_COMMUNITY): Payer: Self-pay | Admitting: *Deleted

## 2024-09-25 NOTE — Telephone Encounter (Signed)
 Opened in error; Disregard.

## 2024-09-25 NOTE — Telephone Encounter (Signed)
 09/25/2024  Name: Alexandria [name] MRN: [id number] DOB: 05-24-1996  Reason for Call:  Transition of Care Hospital Discharge Call  Contact Status: Patient Contact Status: Message  Language assistant needed:          Follow-Up Questions:    Van Postnatal Depression Scale:  In the Past 7 Days:    PHQ2-9 Depression Scale:     Discharge Follow-up:    Post-discharge interventions: NA  Mliss Sieve, RN 09/25/2024  11:25

## 2024-11-01 ENCOUNTER — Ambulatory Visit: Payer: Self-pay | Admitting: Family Medicine

## 2024-11-01 NOTE — Progress Notes (Deleted)
    Post Partum Visit Note  Alexandria [name] is a 28 y.o. [id number] female who presents for a postpartum visit. She is {1-10:13787} {time; units:18646} postpartum following a normal spontaneous vaginal delivery.  I have fully reviewed the prenatal and intrapartum course. The delivery was at *** gestational weeks.  Anesthesia: epidural. Postpartum course has been ***. Baby is doing well***. Baby is feeding by {breastmilk/bottle:69}. Bleeding {vag bleed:12292}. Bowel function is {normal:32111}. Bladder function is {normal:32111}. Patient {is/is not:9024} sexually active. Contraception method is {contraceptive method:5051}. Postpartum depression screening: {gen negative/positive:315881}.   The pregnancy intention screening data noted above was reviewed. Potential methods of contraception were discussed. The patient elected to proceed with No data recorded.    Health Maintenance Due  Topic Date Due   HPV VACCINES (1 - 3-dose series) Never done   Hepatitis B Vaccines 19-59 Average Risk (1 of 3 - 19+ 3-dose series) Never done   Cervical Cancer Screening (Pap smear)  Never done   Influenza Vaccine  07/02/2024   COVID-19 Vaccine (1 - 2025-26 season) Never done    {Common ambulatory SmartLinks:19316}  Review of Systems {ros; complete:30496}  Objective:  LMP 12/27/2023    General:  {gen appearance:16600}   Breasts:  {desc; normal/abnormal/not indicated:14647}  Lungs: {lung exam:16931}  Heart:  {heart exam:5510}  Abdomen: {abdomen exam:16834}   Wound {Wound assessment:11097}  GU exam:  {desc; normal/abnormal/not indicated:14647}       Assessment:    There are no diagnoses linked to this encounter.  *** postpartum exam.   Plan:   Essential components of care per ACOG recommendations:  1.  Mood and well being: Patient with {gen negative/positive:315881} depression screening today. Reviewed local resources for support.  - Patient tobacco use? {tobacco use:25506}  - hx of  drug use? {yes/no:25505}    2. Infant care and feeding:  -Patient currently breastmilk feeding? {yes/no:25502}  -Social determinants of health (SDOH) reviewed in EPIC. No concerns***The following needs were identified***  3. Sexuality, contraception and birth spacing - Patient {DOES_DOES WNU:81435} want a pregnancy in the next year.  Desired family size is {NUMBER 1-10:22536} children.  - Reviewed reproductive life planning. Reviewed contraceptive methods based on pt preferences and effectiveness.  Patient desired {Up[employer] End Methods:24109} today.   - Discussed birth spacing of 18 months  4. Sleep and fatigue -Encouraged family/partner/community support of 4 hrs of uninterrupted sleep to help with mood and fatigue  5. Physical Recovery  - Discussed patients delivery and complications. She describes her labor as {description:25511} - Patient had a {CHL AMB [phone]}. Patient had a {laceration:25518} laceration. Perineal healing reviewed. Patient expressed understanding - Patient has urinary incontinence? {yes/no:25515} - Patient {ACTION; IS/IS WNU:78978602} safe to resume physical and sexual activity  6.  Health Maintenance - HM due items addressed {Yes or If no, why not?:20788} - Last pap smear No results found for: DIAGPAP Pap smear {done:10129} at today's visit.  -Breast Cancer screening indicated? {indicated:25516}  7. Chronic Disease/Pregnancy Condition follow up: {Follow up:25499}  - PCP follow up  Suzen Maryan Masters, MD Center for [facility], [facility]
